# Patient Record
Sex: Male | Born: 1935 | Race: White | Hispanic: No | Marital: Married | Smoking: Former smoker
Health system: Southern US, Community
[De-identification: ages and names within clinical notes are randomized; demographics above are authoritative.]

## PROBLEM LIST (undated history)

## (undated) DIAGNOSIS — E119 Type 2 diabetes mellitus without complications: Secondary | ICD-10-CM

## (undated) DIAGNOSIS — Z77090 Contact with and (suspected) exposure to asbestos: Secondary | ICD-10-CM

## (undated) DIAGNOSIS — N183 Chronic kidney disease, stage 3 unspecified: Secondary | ICD-10-CM

## (undated) DIAGNOSIS — J449 Chronic obstructive pulmonary disease, unspecified: Secondary | ICD-10-CM

## (undated) DIAGNOSIS — I10 Essential (primary) hypertension: Secondary | ICD-10-CM

## (undated) DIAGNOSIS — K219 Gastro-esophageal reflux disease without esophagitis: Secondary | ICD-10-CM

## (undated) DIAGNOSIS — I2510 Atherosclerotic heart disease of native coronary artery without angina pectoris: Secondary | ICD-10-CM

## (undated) DIAGNOSIS — E785 Hyperlipidemia, unspecified: Secondary | ICD-10-CM

## (undated) DIAGNOSIS — R9389 Abnormal findings on diagnostic imaging of other specified body structures: Secondary | ICD-10-CM

## (undated) HISTORY — PX: OTHER SURGICAL HISTORY: SHX169

## (undated) HISTORY — DX: Chronic kidney disease, stage 3 unspecified: N18.30

## (undated) HISTORY — PX: CARDIAC CATHETERIZATION: SHX172

## (undated) HISTORY — DX: Type 2 diabetes mellitus without complications: E11.9

## (undated) HISTORY — DX: Hyperlipidemia, unspecified: E78.5

## (undated) HISTORY — DX: Essential (primary) hypertension: I10

## (undated) HISTORY — DX: Gastro-esophageal reflux disease without esophagitis: K21.9

## (undated) HISTORY — DX: Chronic kidney disease, stage 3 (moderate): N18.3

## (undated) HISTORY — DX: Chronic obstructive pulmonary disease, unspecified: J44.9

## (undated) HISTORY — DX: Atherosclerotic heart disease of native coronary artery without angina pectoris: I25.10

## (undated) HISTORY — DX: Contact with and (suspected) exposure to asbestos: Z77.090

## (undated) HISTORY — DX: Abnormal findings on diagnostic imaging of other specified body structures: R93.89

## (undated) HISTORY — PX: CHOLECYSTECTOMY: SHX55

## (undated) HISTORY — PX: INGUINAL HERNIA REPAIR: SUR1180

---

## 2001-01-13 ENCOUNTER — Encounter: Payer: Self-pay | Admitting: Family Medicine

## 2001-01-13 ENCOUNTER — Encounter: Admission: RE | Admit: 2001-01-13 | Discharge: 2001-01-13 | Payer: Self-pay | Admitting: Family Medicine

## 2001-08-27 ENCOUNTER — Encounter: Payer: Self-pay | Admitting: Preventative Medicine

## 2001-08-27 ENCOUNTER — Ambulatory Visit (HOSPITAL_COMMUNITY): Admission: RE | Admit: 2001-08-27 | Discharge: 2001-08-27 | Payer: Self-pay | Admitting: Preventative Medicine

## 2001-12-03 ENCOUNTER — Encounter: Admission: RE | Admit: 2001-12-03 | Discharge: 2001-12-03 | Payer: Self-pay | Admitting: Family Medicine

## 2001-12-03 ENCOUNTER — Encounter: Payer: Self-pay | Admitting: Family Medicine

## 2002-01-05 ENCOUNTER — Ambulatory Visit (HOSPITAL_BASED_OUTPATIENT_CLINIC_OR_DEPARTMENT_OTHER): Admission: RE | Admit: 2002-01-05 | Discharge: 2002-01-05 | Payer: Self-pay | Admitting: Urology

## 2002-01-08 ENCOUNTER — Encounter: Payer: Self-pay | Admitting: Emergency Medicine

## 2002-01-08 ENCOUNTER — Emergency Department (HOSPITAL_COMMUNITY): Admission: EM | Admit: 2002-01-08 | Discharge: 2002-01-08 | Payer: Self-pay | Admitting: Emergency Medicine

## 2002-04-17 ENCOUNTER — Encounter: Payer: Self-pay | Admitting: Urology

## 2002-04-17 ENCOUNTER — Encounter: Admission: RE | Admit: 2002-04-17 | Discharge: 2002-04-17 | Payer: Self-pay | Admitting: Urology

## 2002-06-01 ENCOUNTER — Ambulatory Visit (HOSPITAL_COMMUNITY): Admission: RE | Admit: 2002-06-01 | Discharge: 2002-06-01 | Payer: Self-pay | Admitting: Preventative Medicine

## 2002-06-01 ENCOUNTER — Encounter: Payer: Self-pay | Admitting: Preventative Medicine

## 2002-07-13 ENCOUNTER — Emergency Department (HOSPITAL_COMMUNITY): Admission: EM | Admit: 2002-07-13 | Discharge: 2002-07-13 | Payer: Self-pay | Admitting: Emergency Medicine

## 2002-07-24 ENCOUNTER — Encounter: Payer: Self-pay | Admitting: Urology

## 2002-07-24 ENCOUNTER — Encounter: Admission: RE | Admit: 2002-07-24 | Discharge: 2002-07-24 | Payer: Self-pay | Admitting: Urology

## 2002-07-27 ENCOUNTER — Ambulatory Visit (HOSPITAL_BASED_OUTPATIENT_CLINIC_OR_DEPARTMENT_OTHER): Admission: RE | Admit: 2002-07-27 | Discharge: 2002-07-27 | Payer: Self-pay | Admitting: Urology

## 2002-07-27 ENCOUNTER — Encounter: Payer: Self-pay | Admitting: Urology

## 2003-07-22 ENCOUNTER — Ambulatory Visit (HOSPITAL_COMMUNITY): Admission: RE | Admit: 2003-07-22 | Discharge: 2003-07-22 | Payer: Self-pay | Admitting: Urology

## 2004-01-04 ENCOUNTER — Encounter: Admission: RE | Admit: 2004-01-04 | Discharge: 2004-01-04 | Payer: Self-pay | Admitting: Family Medicine

## 2005-02-01 ENCOUNTER — Ambulatory Visit: Payer: Self-pay | Admitting: Internal Medicine

## 2005-02-09 ENCOUNTER — Ambulatory Visit: Payer: Self-pay | Admitting: Cardiology

## 2005-02-16 ENCOUNTER — Ambulatory Visit (HOSPITAL_COMMUNITY): Admission: RE | Admit: 2005-02-16 | Discharge: 2005-02-16 | Payer: Self-pay | Admitting: Internal Medicine

## 2005-03-27 ENCOUNTER — Encounter: Admission: RE | Admit: 2005-03-27 | Discharge: 2005-03-27 | Payer: Self-pay | Admitting: Family Medicine

## 2006-01-31 ENCOUNTER — Ambulatory Visit: Payer: Self-pay | Admitting: Internal Medicine

## 2006-05-27 ENCOUNTER — Encounter: Admission: RE | Admit: 2006-05-27 | Discharge: 2006-05-27 | Payer: Self-pay | Admitting: Interventional Cardiology

## 2006-05-30 ENCOUNTER — Inpatient Hospital Stay (HOSPITAL_BASED_OUTPATIENT_CLINIC_OR_DEPARTMENT_OTHER): Admission: RE | Admit: 2006-05-30 | Discharge: 2006-05-30 | Payer: Self-pay | Admitting: Interventional Cardiology

## 2007-01-15 DIAGNOSIS — K219 Gastro-esophageal reflux disease without esophagitis: Secondary | ICD-10-CM

## 2007-01-15 DIAGNOSIS — Z77090 Contact with and (suspected) exposure to asbestos: Secondary | ICD-10-CM | POA: Insufficient documentation

## 2007-01-15 DIAGNOSIS — E119 Type 2 diabetes mellitus without complications: Secondary | ICD-10-CM | POA: Insufficient documentation

## 2007-01-16 ENCOUNTER — Ambulatory Visit: Payer: Self-pay | Admitting: Internal Medicine

## 2008-01-16 ENCOUNTER — Ambulatory Visit: Payer: Self-pay | Admitting: Internal Medicine

## 2008-01-25 DIAGNOSIS — I2510 Atherosclerotic heart disease of native coronary artery without angina pectoris: Secondary | ICD-10-CM

## 2008-01-25 DIAGNOSIS — J449 Chronic obstructive pulmonary disease, unspecified: Secondary | ICD-10-CM

## 2008-02-02 ENCOUNTER — Ambulatory Visit: Payer: Self-pay | Admitting: Internal Medicine

## 2008-02-02 DIAGNOSIS — R0602 Shortness of breath: Secondary | ICD-10-CM

## 2009-02-08 ENCOUNTER — Ambulatory Visit: Payer: Self-pay | Admitting: Internal Medicine

## 2009-08-16 ENCOUNTER — Inpatient Hospital Stay (HOSPITAL_COMMUNITY): Admission: EM | Admit: 2009-08-16 | Discharge: 2009-08-18 | Payer: Self-pay | Admitting: Cardiology

## 2009-08-16 ENCOUNTER — Encounter: Payer: Self-pay | Admitting: Emergency Medicine

## 2009-08-16 ENCOUNTER — Ambulatory Visit: Payer: Self-pay | Admitting: Cardiovascular Disease

## 2009-08-16 ENCOUNTER — Other Ambulatory Visit: Payer: Self-pay | Admitting: Cardiology

## 2009-08-16 ENCOUNTER — Other Ambulatory Visit: Payer: Self-pay | Admitting: Emergency Medicine

## 2009-10-20 ENCOUNTER — Ambulatory Visit (HOSPITAL_COMMUNITY): Admission: RE | Admit: 2009-10-20 | Discharge: 2009-10-20 | Payer: Self-pay | Admitting: Ophthalmology

## 2010-02-09 ENCOUNTER — Ambulatory Visit: Payer: Self-pay | Admitting: Internal Medicine

## 2010-04-23 ENCOUNTER — Encounter: Payer: Self-pay | Admitting: Orthopedic Surgery

## 2010-05-02 NOTE — Assessment & Plan Note (Signed)
Summary: 12 months/apc   Primary Provider/Referring Provider:  Dr. Manus Gunning  CC:  Yearly follow up visit-COPD.Marland Kitchen  History of Present Illness:  HISTORY:  One year follow up. He says that his sense of smell is not as good, but he is not aware of significant nasal congestion or post-nasal drainage. Coughs up some white mucus occasionally. Nothing bloody or purulent. No chest pain. He had a heart cath for Dr. Garnette Scheuermann demonstrating a non-critical coronary disease and he carries nitroglycerin, but he has not needed it. He had a second pneumococcal vaccine in 2005. He requests flu shot.  01/16/08- Feeling well exceept for back pain.No sputm. Last month 5 days sinus cong. Likes Advair.Discussed Flu vax. DOE hills or hurry. No waso from breathing. Some angina, stable- Dr. Katrinka Blazing.  01/15/08- Comes with wife. Denies acute change, cough, phlegm, blood, nodes, palpitation. Occ stable angina-Dr. Katrinka Blazing. Bach ache- Dr Manus Gunning. No change exercise/ dyspnea level.Discussed flu season.  February 08, 2009- COPD, CAD, hx asbestos exposure..............................Marland Kitchenwife here Got flu vax. Says he is doing well. Heat and humidity are hard on him. Says cardiac and blood sugar status is good. Denies any respiratory infection, dyspnea, cough or wheeze. CXR- stable pleural plaques PFT- mild obstructive disease with little dilator response - 97%, 97%, 98% 316m  February 09, 2010- COPD, CAD, hx asbestos exposure..............................Marland Kitchenwife here Nurse-CC: Yearly follow up visit-COPD. Got flu shot. Says breathing has been stable. Had cataract surgery. Hosp by Dr Katrinka Blazing R/O MI, heart cath showed no MI, improved CAD. Uses Mucinex to clear mucus. Able to [employer], walk dog. DOE with brisk walk and stairs. Uses nebulizer two times a day and extra if needed. Uses Advair 1x daily. Dealing w/ donut hole.   Preventive Screening-Counseling & Management  Alcohol-Tobacco     Smoking Status: quit  Packs/Day: 2.0     Year Started: 1951     Year Quit: 2001  Current Medications (verified): 1)  Advair Diskus 250-50 Mcg/dose Misc (Fluticasone-Salmeterol) .Marland Kitchen.. 1 Puff Two Times A Day 2)  Glipizide 5 Mg  Tabs (Glipizide) .Marland Kitchen.. 1 Two Times A Day 3)  Glucophage 1000 Mg  Tabs (Metformin Hcl) .... Bid 4)  Lisinopril 5 Mg  Tabs (Lisinopril) .Marland Kitchen.. 1 Once Daily 5)  Hydrochlorothiazide 25 Mg  Tabs (Hydrochlorothiazide) .Marland Kitchen.. 1 Once Daily 6)  Crestor 5 Mg Tabs (Rosuvastatin Calcium) .... Take 1 By Mouth Once Daily 7)  Januvia 100 Mg  Tabs (Sitagliptin Phosphate) 8)  Proair Hfa 108 (90 Base) Mcg/act Aers (Albuterol Sulfate) .... 2 Puffs Every 4 Hours As Needed 9)  Aspirin Adult Low Strength 81 Mg Tbec (Aspirin) .Marland Kitchen.. 1 Once Daily 10)  Isosorbide Dinitrate 30 Mg Tabs (Isosorbide Dinitrate) .Marland Kitchen.. 1  Every Morning 11)  Nitro-Dur 0.4 Mg/hr Pt24 (Nitroglycerin) .... As Needed 12)  Albuterol Sulfate (2.5 Mg/48ml) 0.083% Nebu (Albuterol Sulfate) .Marland Kitchen.. 1 Neb Four Times A Day As Needed 13)  Nebulizer Machine For [employer], [employer] .... Four Times A Day As Needed 14)  Travatan Z 0.004 % Soln (Travoprost) .Marland Kitchen.. 1 Drop  in Both Eyes Every Evening  Allergies (verified): 1)  ! Morphine 2)  Codeine  Past History:  Past Medical History: Last updated: 01/16/2008 COPD (ICD-496) Hx of HISTORY OF ASBESTOS EXPOSURE (ICD-V15.84) * ABNORMAL CHEST X-RAY CAD (ICD-414.00) DM (ICD-250.00) GERD (ICD-530.81)  Family History: Last updated: 2008/02/27 mother died of natural causes father died of heart disease, lung cancer 2 siblings both healthy  Social History: Last updated: 2008/02/27 Former smoker.  Quit in 2003.  Smoked 2 ppd x 50 years Married- wife has lung ca 2  Children Quit ETOH  Risk Factors: Smoking Status: quit (02/09/2010) Packs/Day: 2.0 (02/09/2010)  Past Surgical History: Inguinal hernia Cardiac cath- 2011 Dr Katrinka Blazing Cholecystectomy Cataracts -2011  Social History: Packs/Day:  2.0  Review  of Systems      See HPI       The patient complains of shortness of breath with activity.  The patient denies shortness of breath at rest, productive cough, non-productive cough, coughing up blood, chest pain, irregular heartbeats, acid heartburn, indigestion, loss of appetite, weight change, abdominal pain, difficulty swallowing, sore throat, tooth/dental problems, headaches, nasal congestion/difficulty breathing through nose, and sneezing.    Vital Signs:  Patient profile:   77 year old male Height:      68 inches Weight:      174.50 pounds BMI:     26.63 O2 Sat:      96 % on Room air Pulse rate:   77 / minute BP sitting:   130 / 72  (left arm) Cuff size:   regular  Vitals Entered By: Reynaldo Minium CMA (February 09, 2010 9:10 AM)  O2 Flow:  Room air CC: Yearly follow up visit-COPD.   Physical Exam  Additional Exam:  General: A/Ox3; pleasant and cooperative, NAD, SKIN: no rash, lesions NODES: no lymphadenopathy HEENT: [place]/AT, EOM- WNL, Conjuctivae- clear, PERRLA, TM-WNL, Nose- clear, Throat- clear and wnl, Mallampati  III NECK: Supple w/ fair ROM, JVD- none, normal carotid impulses w/o bruits Thyroid- normal to palpation CHEST: Clear to P&A HEART: RRR, no m/g/r heard ABDOMEN: overweight ZOX:WRUE, nl pulses, no edema  NEURO: Grossly intact to observation      Impression & Recommendations:  Problem # 1:  COPD (ICD-496) Clinically controlled. Finances may be a problem later. No changes to offer for now. We discussed sme medication options.  Problem # 2:  Hx of HISTORY OF ASBESTOS EXPOSURE (ICD-V15.84) Pleural plaques. No clinical activity. Will update CXR   Medications Added to Medication List This Visit: 1)  Crestor 5 Mg Tabs (Rosuvastatin calcium) .... Take 1 by mouth once daily  Other Orders: Est. Patient Level III (45409) T-2 View CXR (71020TC)  Patient Instructions: 1)  Please schedule a follow-up appointment in 1 year. 2)  A chest x-ray has been  recommended.  Your imaging study may require preauthorization.  3)  Sample Advair 250

## 2010-06-17 LAB — GLUCOSE, CAPILLARY: Glucose-Capillary: 180 mg/dL — ABNORMAL HIGH (ref 70–99)

## 2010-06-18 LAB — BASIC METABOLIC PANEL
BUN: 19 mg/dL (ref 6–23)
Chloride: 101 mEq/L (ref 96–112)
Creatinine, Ser: 1.16 mg/dL (ref 0.4–1.5)
Glucose, Bld: 239 mg/dL — ABNORMAL HIGH (ref 70–99)

## 2010-06-18 LAB — HEMOGLOBIN AND HEMATOCRIT, BLOOD: Hemoglobin: 11.7 g/dL — ABNORMAL LOW (ref 13.0–17.0)

## 2010-06-19 LAB — LIPID PANEL
Cholesterol: 108 mg/dL (ref 0–200)
LDL Cholesterol: 55 mg/dL (ref 0–99)
Total CHOL/HDL Ratio: 3.4 RATIO
Triglycerides: 104 mg/dL (ref <150)
VLDL: 21 mg/dL (ref 0–40)

## 2010-06-19 LAB — BASIC METABOLIC PANEL
BUN: 18 mg/dL (ref 6–23)
CO2: 26 mEq/L (ref 19–32)
CO2: 27 mEq/L (ref 19–32)
Calcium: 8.9 mg/dL (ref 8.4–10.5)
Chloride: 102 mEq/L (ref 96–112)
Creatinine, Ser: 1.23 mg/dL (ref 0.4–1.5)
GFR calc Af Amer: >60 mL/min (ref >60)
GFR calc non Af Amer: 60 mL/min — ABNORMAL LOW (ref >60)
Glucose, Bld: 155 mg/dL — ABNORMAL HIGH (ref 70–99)
Glucose, Bld: 192 mg/dL — ABNORMAL HIGH (ref 70–99)
Potassium: 4.2 mEq/L (ref 3.5–5.1)
Sodium: 137 mEq/L (ref 135–145)

## 2010-06-19 LAB — CARDIAC PANEL(CRET KIN+CKTOT+MB+TROPI)
CK, MB: 7.2 ng/mL (ref 0.3–4.0)
CK, MB: 7.2 ng/mL (ref 0.3–4.0)
CK, MB: 8.9 ng/mL (ref 0.3–4.0)
Relative Index: 1.4 (ref 0.0–2.5)
Total CK: 625 U/L — ABNORMAL HIGH (ref 7–232)
Total CK: 696 U/L — ABNORMAL HIGH (ref 7–232)
Troponin I: <0.01 ng/mL (ref 0.00–0.06)

## 2010-06-19 LAB — APTT: aPTT: 158 seconds — ABNORMAL HIGH (ref 24–37)

## 2010-06-19 LAB — CBC
HCT: 37.2 % — ABNORMAL LOW (ref 39.0–52.0)
HCT: 34.9 % — ABNORMAL LOW (ref 39.0–52.0)
HCT: 36 % — ABNORMAL LOW (ref 39.0–52.0)
Hemoglobin: 12.9 g/dL — ABNORMAL LOW (ref 13.0–17.0)
Hemoglobin: 11.9 g/dL — ABNORMAL LOW (ref 13.0–17.0)
Hemoglobin: 12.3 g/dL — ABNORMAL LOW (ref 13.0–17.0)
MCHC: 34.5 g/dL (ref 30.0–36.0)
MCHC: 34 g/dL (ref 30.0–36.0)
MCHC: 34.3 g/dL (ref 30.0–36.0)
MCV: 79.2 fL (ref 78.0–100.0)
MCV: 80.6 fL (ref 78.0–100.0)
Platelets: 185 10*3/uL (ref 150–400)
Platelets: 148 10*3/uL — ABNORMAL LOW (ref 150–400)
RBC: 4.47 MIL/uL (ref 4.22–5.81)
RDW: 17.1 % — ABNORMAL HIGH (ref 11.5–15.5)
RDW: 17 % — ABNORMAL HIGH (ref 11.5–15.5)

## 2010-06-19 LAB — BRAIN NATRIURETIC PEPTIDE: Pro B Natriuretic peptide (BNP): <30 pg/mL (ref 0.0–100.0)

## 2010-06-19 LAB — POCT CARDIAC MARKERS: Troponin i, poc: <0.05 ng/mL (ref 0.00–0.09)

## 2010-06-19 LAB — DIFFERENTIAL
Basophils Absolute: 0 10*3/uL (ref 0.0–0.1)
Basophils Relative: 0 % (ref 0–1)
Eosinophils Absolute: 0.2 10*3/uL (ref 0.0–0.7)
Eosinophils Relative: 4 % (ref 0–5)
Lymphocytes Relative: 17 % (ref 12–46)
Monocytes Absolute: 0.5 10*3/uL (ref 0.1–1.0)

## 2010-06-19 LAB — GLUCOSE, CAPILLARY
Glucose-Capillary: 185 mg/dL — ABNORMAL HIGH (ref 70–99)
Glucose-Capillary: 189 mg/dL — ABNORMAL HIGH (ref 70–99)
Glucose-Capillary: 235 mg/dL — ABNORMAL HIGH (ref 70–99)

## 2010-06-19 LAB — HEPARIN LEVEL (UNFRACTIONATED): Heparin Unfractionated: 0.35 IU/mL (ref 0.30–0.70)

## 2010-08-15 NOTE — Assessment & Plan Note (Signed)
Stephen [name]                             PULMONARY OFFICE NOTE   Stephen [name], Stephen [name]                     MRN:          [id number]  DATE:01/16/2007                            DOB:          1932-05-12    PROBLEMS:  1. Chronic obstructive pulmonary disease.  2. History of asbestos exposure.  3. Abnormal chest x-ray.  4. Esophageal reflux.  5. Diabetes.  6. Coronary disease.   HISTORY:  One year follow up. He says that his sense of smell is not as  good, but he is not aware of significant nasal congestion or post-nasal  drainage. Coughs up some white mucus occasionally. Nothing bloody or  purulent. No chest pain. He had a heart cath for Dr. Garnette Scheuermann  demonstrating a non-critical coronary disease and he carries  nitroglycerin, but he has not needed it. He had a second pneumococcal  vaccine in 2005. He requests flu shot.   MEDICATIONS:  1. Spiriva.  2. Asmanex 2 puffs daily.  3. Albuterol 2 puffs q.i.d. p.r.n.  4. Glipizide 5 mg.  5. Glucophage 1000 mg b.i.d.  6. Lisinopril 5 mg.  7. Hydrochlorothiazide 25 mg.  8. Crestor 5 mg.  9. Trusopt 2%.  10.Xalatan.  11.Januvia 100 mg.  12.Albuterol rescue inhaler.  13.Nitroglycerin p.r.n.   Drug intolerant nausea from CODEINE.   Chest x-ray in 2005 had shown stable bilateral calcified plural plaques  that are unchanged from earlier that year.   OBJECTIVE:  Weight 185 pounds, blood pressure 114/60, pulse 81, room air  saturation 98%. He is alert. Chest sounds clear. I do not hear crackles  or rubs. He does not cough. I find no adenopathy. Heart sounds are  regular with no murmur or gallop. There is no edema. No significant  clubbing.   IMPRESSION:  1. Stable chronic obstructive pulmonary disease.  2. Asbestos plaques.   PLAN:  Chest x-ray, flu vaccine discussed and given. Schedule return 1  year, earlier p.r.n.     Stephen D. Maple Hudson, MD, Stephen [name], FACP  Electronically Signed    CDY/MedQ   DD: 01/17/2007  DT: 01/18/2007  Job #: [id number]   cc:   Stephen [name]. Stephen [name], M.D.

## 2010-08-18 NOTE — Cardiovascular Report (Signed)
NAMECYAN, Stephen [name] NO.:  [id number]   MEDICAL RECORD NO.:  [id number]          PATIENT TYPE:  OIB   LOCATION:  1966                         FACILITY:  [facility]   PHYSICIAN:  Lyn Records, M.D.   DATE OF BIRTH:  Aug 28, 1932   DATE OF PROCEDURE:  05/30/2006  DATE OF DISCHARGE:                            CARDIAC CATHETERIZATION   INDICATIONS:  The patient had been having exertional chest tightness and  has multiple risk factors for coronary artery disease including his age,  prior cigarette smoking history, hyperlipidemia and diabetes.  He had a  Cardiolite study performed on May 22, 2006, that demonstrated  inferobasal ischemia.  The study is being done to define coronary  anatomy.   PROCEDURES PERFORMED:  1. Left heart catheterization.  2. Selective coronary angiography.  3. Left ventriculography.   DESCRIPTION:  A 4-French sheath was placed in the right femoral artery  using the modified Seldinger technique.  A 4-French A2 multipurpose  catheter was used for hemodynamic recordings, left ventriculography by  hand injection, and selective right coronary angiography.  A 4-French #4  left Judkins catheter was used for left coronary angiography.  The  patient tolerated the procedure without complications.   RESULTS:   HEMODYNAMIC DATA:  1. Aortic pressure 134/70.      a.     Left ventricular pressure 141/10.   LEFT VENTRICULOGRAPHY:  The left ventriculogram demonstrates low normal  LV function with EF of 50%.  No regional wall motion abnormality is  noted.  No mitral regurgitation is noted.   CORONARY ANGIOGRAPHY:  1. Left main coronary:  Widely patent.  2. Left anterior descending coronary:  This is a large vessel that      wraps around the left ventricular apex.  On the first several      images the mid-LAD was narrowed diffusely.  After intracoronary      nitroglycerin the vessel was widely patent.  One dominant diagonal      branch arises from  the proximal vessel.  Proximal to this there is      eccentric 30-40% narrowing in the LAD.  No significant obstruction      in the LAD is noted.  3. Circumflex artery:  The circumflex coronary artery is a large      vessel that trifurcates on the left lateral wall and contains no      obstruction.  4. Right coronary:  The right coronary is a dominant vessel giving to      left ventricular branches and a large posterior descending branch.      The vessel is normal without any significant obstruction.   CONCLUSION:  1. Normal left ventricular function.  2. Less than 50% obstruction in the proximal left anterior descending      artery, possibly causing endothelial dysfunction that could account      for the patient's discomfort.  No significant obstructive lesions      are noted.   PLAN:  Aggressive risk factor modification including increasing the dose  of the patient's Crestor to 4-10 mg per day, one aspirin per  day, p.r.n.  sublingual nitroglycerin if chest discomfort.  No further cardiac  evaluation is necessary.  Aggressive blood sugar control will also be  helpful.      Lyn Records, M.D.  Electronically Signed     HWS/MEDQ  D:  05/30/2006  T:  05/30/2006  Job:  [id number]   cc:   Bryan Lemma. Manus Gunning, M.D.

## 2010-08-18 NOTE — H&P (Signed)
[name], [name] NO.:  [id number]   MEDICAL RECORD NO.:  [id number]          PATIENT TYPE:  OIB   LOCATION:  1966                         FACILITY:  [facility]   PHYSICIAN:  Lyn Records, M.D.   DATE OF BIRTH:  [birth year]   DATE OF ADMISSION:  05/30/2006  DATE OF DISCHARGE:                              HISTORY & PHYSICAL   CHIEF COMPLAINT:  Chest pain.   [name] is a 77 year old male with no known history of coronary  artery disease who has recently experienced substernal chest pain, both  with and without exertion.  When he does have chest pain with exertion,  his pain is better with rest.  He denies any other constitutional  symptoms, including dyspnea or palpitations.  The patient was sent for a  stress Cardiolite on May 22, 2006 by his primary care physician and  this showed inferior basal ischemia.  He is now here for cardiac  catheterization.   ALLERGIES:  CODEINE.   MEDICATIONS:  1. Spiriva daily.  2. Asmanex 2 puffs q.p.m.  3. Albuterol p.r.n.  4. Glipizide 5 mg b.i.d.  5. Glucophage 1000 mg b.i.d.  6. Lisinopril 5 mg a day.  7. Hydrochlorothiazide 25 mg a day.  8. Crestor 5 mg a day.  9. Trusopt 2% one drop both eyes b.i.d.  10.Xalatan 0.05% one drop both eyes q.h.s.  11.Januvia 100 mg daily.  12.The patient all meds today and he knows to hold the Glucophage and      Januvia for 48 hours post-procedure.   SOCIAL HISTORY:  He quit smoking 3 years ago.  No alcohol or illicit  drug use.   FAMILY HISTORY:  Dad died with fluid around his heart.  Mom died of  natural causes.   PAST MEDICAL HISTORY:  1. Diabetes, oral agents.  2. Hyperlipidemia, treated.  3. Chronic bronchitis, multiple medications.  4. Nephrolithiasis.  5. Glaucoma, treated.  6. Status post cholecystectomy.  7. Status post abdominal hernia repair.   VITAL SIGNS:  Blood pressure 120/60.  Pulse 70.  Respirations 20.  HEENT:  Grossly normal.  No carotid or  subclavian bruits.  No JVD or  thyromegaly.  Sclerae are clear.  Conjunctivae normal.  Nares without  drainage.  CHEST:  Clear to auscultation bilaterally.  No wheezing or rhonchi.  HEART:  Regular rate and rhythm.  No gross murmur.  ABDOMEN:  Good bowel sounds.  Nontender, nondistended.  No masses.  No  bruits.  EXTREMITIES:  No peripheral edema.  No femoral bruits.  NEURO:  Normal mood and affect.  SKIN:  Warm and dry.   LAB STUDIES:  Show a hemoglobin of 11.4, hematocrit of 33.6, white count  4.5, platelets 189.  PT 13.1, INR 1.15.  Sodium 138, potassium 4.6, BUN  16, creatinine 1.3, glucose 224.  LFTs are normal.  Chest x-ray shows no  active disease.   ASSESSMENT/PLAN:  1. Chest pain.  2. Diabetes mellitus, treated.  3. Hyperlipidemia, treated.  4. Glaucoma, treated.  5. Chronic bronchitis, on multiple inhalers.  6. Nephrolithiasis.  7. Status post cholecystectomy.  8.  Status post abdominal hernia repair.   The patient is here for cardiac catheterization.  He understands the  risks and benefits of the procedure, including the risk of heart attack,  stroke, death or groin complications including, but not limited to,  ecchymosis.  He agrees to proceed with the cardiac catheterization,  which will be performed by Dr. Verdis Prime.      Guy Franco, P.A.      Lyn Records, M.D.  Electronically Signed    LB/MEDQ  D:  05/30/2006  T:  05/30/2006  Job:  [id number]   cc:   Gaylan Gerold, M.D.

## 2010-08-18 NOTE — Op Note (Signed)
   NAME:  Stephen [name], Stephen [name] NO.:  [id number]   MEDICAL RECORD NO.:  [id number]                   PATIENT TYPE:  AMB   LOCATION:  [facility]                                 FACILITY:  [facility]   PHYSICIAN:  Valetta Fuller, M.D.               DATE OF BIRTH:  01-11-1933   DATE OF PROCEDURE:  DATE OF DISCHARGE:                                 OPERATIVE REPORT   PREOPERATIVE DIAGNOSES:  Gross hematuria with intermittent voiding symptoms  and nonspecific right back discomfort.   POSTOPERATIVE DIAGNOSES:  Gross hematuria with intermittent voiding symptoms  and nonspecific right back discomfort.   PROCEDURE:  Cystoscopy, bilateral retrograde pyelography.   SURGEON:  Valetta Fuller, M.D.   ANESTHESIA:  General.   INDICATIONS FOR PROCEDURE:  Stephen [name] is a 78 year old male. He for  several weeks now has had some nonspecific low back discomfort. The pain was  initially in the lumbar region and also on the right side of his lower back  with some radiation across his back. It was not classic for renal colic but  certainly could be considered consistent with that as a possible diagnosis.  He also had some increased irritative voiding symptoms and certainly a  distal ureteral stone was consideration for diagnosis. Dr. Manus Gunning obtained  a CT scan which showed no abnormalities. We noted that he had continued  microscopic hematuria as well as some blood tinged urine grossly. We  recommended cystoscopy. The patient was hesitant to have that done in our  office. We felt it reasonable to go ahead with an anesthetic so we could  also do retrograde pyelograms to rule out a small filling defect that may  have been missed on the CT scan.   TECHNIQUE AND FINDINGS:  The patient was brought to the operating room where  he had successful induction of general anesthesia. Cystoscopy revealed some  trilobar hyperplasia with a medium size middle lobe. The bladder was  endoscopically  normal and saw no evidence of any abnormalities. Both  ureteral orifices were normal and there was no evidence of edema on the  __________  trigone. Retrograde pyelogram showed no evidence of obstruction  or filling defects bilaterally. Overall, we felt there was no obvious  urologic pathology. He was brought to the recovery room in stable condition.                                               Valetta Fuller, M.D.   DSG/MEDQ  D:  01/05/2002  T:  01/05/2002  Job:  [id number]   cc:   Bryan Lemma. Manus Gunning, M.D.  [address]  [place] [address]  Fax: [phone]

## 2010-08-18 NOTE — Assessment & Plan Note (Signed)
[facility]                               PULMONARY OFFICE NOTE   DUVALL, COMES                     MRN:          [id number]  DATE:01/31/2006                            DOB:          1932-06-06    PROBLEMS:  1. Chronic obstructive pulmonary disease.  2. History of asbestos exposure.  3. Abnormal chest x-ray.  4. Esophageal reflux.  5. Diabetes.  6. History kidney stones.   HISTORY:  He comes for followup today feeling stable through the year, with  no colds this year.  He did avoid the heat.  Occasionally sinuses kick up  causing postnasal drainage and some throat-clearing cough.  There has been  no blood, nothing purulent.  Exertional dyspnea if he hurries, but he is  okay mowing the grass as long as he paces himself.  Dr. Manus Gunning gets a chest  x-ray about once a year.  He has not had chest pain, bleeding or weight  loss.   MEDICATIONS:  1. Spiriva.  2. Asmanex 2 puffs daily.  3. Albuterol 2 puffs q.i.d. p.r.n.  4. Glipizide 5 mg.  5. Glucophage 1000 mg b.i.d.  6. Lisinopril 5 mg.  7. Hydrochlorothiazide 25 mg.  8. Crestor 5 mg.  9. Trusopt 2% eye drops.  10.Xalatan eye drops.  11.Januvia 100 mg.  12.Albuterol rescue inhaler.   ALLERGIES:  Drug intolerance to CODEINE with nausea.   OBJECTIVE:  Weight 183 pounds, BP 116/62, pulse regular 80, room air  saturation 98%.  He is a stocky-built man with no adenopathy.  Heart sounds are regular.  There is no neck vein distention, clubbing or edema.  Lungs sound clear.  Specifically I do not hear fine rales or crackles, or  any pleural rub.   PFT:  There is mild to moderate obstructive disease, mainly seen in small  airway flows which are at 32% of predicted, with slight response to  bronchodilator.  Measured total lung capacity is normal.  Diffusion is  mildly reduced at 74%.  Chest CT from February 09, 2005, had shown bilateral  benign-appearing pleural plaques consistent with  known asbestos exposure,  post cholecystectomy changes with fatty liver.   IMPRESSION:  1. Mild chronic obstructive pulmonary disease.  2. Asbestos exposure with pleural plaques, but without parenchymal      fibrosis and with no suspicion at this point of active process.   PLAN:  1. We will get his chest x-ray report for our file, noting that he has had      flu vaccine and had his second pneumococcal vaccine in 2005.  2. Schedule return in 1 year, anticipating a followup chest CT at that      point because of his asbestos exposure.     Clinton D. Maple Hudson, MD, Tonny Bollman, FACP  Electronically Signed    CDY/MedQ  DD: 02/02/2006  DT: 02/03/2006  Job #: [id number]   cc:   Bryan Lemma. Manus Gunning, M.D.  Valetta Fuller, M.D.

## 2010-11-29 ENCOUNTER — Other Ambulatory Visit: Payer: Self-pay | Admitting: Internal Medicine

## 2011-02-06 ENCOUNTER — Encounter: Payer: Self-pay | Admitting: Internal Medicine

## 2011-02-07 ENCOUNTER — Ambulatory Visit (INDEPENDENT_AMBULATORY_CARE_PROVIDER_SITE_OTHER)
Admission: RE | Admit: 2011-02-07 | Discharge: 2011-02-07 | Disposition: A | Discharge status: Home / Self Care | Payer: Medicare Other | Source: Ambulatory Visit | Attending: Internal Medicine | Admitting: Internal Medicine

## 2011-02-07 ENCOUNTER — Encounter: Payer: Self-pay | Admitting: Internal Medicine

## 2011-02-07 ENCOUNTER — Ambulatory Visit (INDEPENDENT_AMBULATORY_CARE_PROVIDER_SITE_OTHER): Payer: Medicare Other | Admitting: Internal Medicine

## 2011-02-07 VITALS — BP 130/60 | HR 83 | Ht 68.0 in | Wt 168.4 lb

## 2011-02-07 DIAGNOSIS — Z77090 Contact with and (suspected) exposure to asbestos: Secondary | ICD-10-CM

## 2011-02-07 DIAGNOSIS — J449 Chronic obstructive pulmonary disease, unspecified: Secondary | ICD-10-CM

## 2011-02-07 NOTE — Patient Instructions (Signed)
Order- CXR- COPD, hx asbestos exposure

## 2011-02-07 NOTE — Progress Notes (Signed)
02/07/11- 71 yoM former smoker followed for COPD with history of asbestos exposure, complicated by CAD, DM, GERD, wife here. LOV-02/09/2010 He says he is "fine". Uses Advair once daily and his nebulizer machine up to 3 times daily. Carries his rescue inhaler but rarely uses it. Cough is usually productive but only white. He denies chest pain. Uses a riding mower to [employer] his own lawn. Chest x-ray: 02/09/2010-COPD and old pleural plaques reflecting asbestos exposure. No new process. I discussed with him the difference between pleural plaques and mesothelioma.  ROS, see HPI Constitutional:   No-   weight loss, night sweats, fevers, chills, fatigue, lassitude. HEENT:   No-  headaches, difficulty swallowing, tooth/dental problems, sore throat,       No-  sneezing, itching, ear ache, nasal congestion, post nasal drip,  CV:  No-   chest pain, orthopnea, PND, swelling in lower extremities, anasarca, dizziness, palpitations Resp: No- acute  shortness of breath with exertion or at rest.              No-   productive cough,  No non-productive cough,  No- coughing up of blood.              No-   change in color of mucus.  No- wheezing.   Skin: No-   rash or lesions. GI:  No-   heartburn, indigestion, abdominal pain, nausea, vomiting, diarrhea,                 change in bowel habits, loss of appetite GU: No-   dysuria, change in color of urine, no urgency or frequency.  No- flank pain. MS:  No-   joint pain or swelling.  No- decreased range of motion.  No- back pain. Neuro-     nothing unusual Psych:  No- change in mood or affect. No depression or anxiety.  No memory loss.  OBJ General- Alert, Oriented, Affect-appropriate, Distress- none acute Skin- rash-none, lesions- none, excoriation- none Lymphadenopathy- none Head- atraumatic            Eyes- Gross vision intact, PERRLA, conjunctivae clear secretions            Ears- Hearing, canals-normal            Nose- Clear, no-Septal dev, mucus, polyps,  erosion, perforation             Throat- Mallampati II , mucosa clear , drainage- none, tonsils- atrophic Neck- flexible , trachea midline, no stridor , thyroid nl, carotid no bruit Chest - symmetrical excursion , unlabored           Heart/CV- RRR , no murmur , no gallop  , no rub, nl s1 s2                           - JVD- none , edema- none, stasis changes- none, varices- none           Lung- clear to P&A, wheeze- none, cough- none , dullness-none, rub- none           Chest wall-  Abd- tender-no, distended-no, bowel sounds-present, HSM- no Br/ Gen/ Rectal- Not done, not indicated Extrem- cyanosis- none, clubbing, none, atrophy- none, strength- nl Neuro- grossly intact to observation

## 2011-02-09 NOTE — Assessment & Plan Note (Signed)
Pleural plaques consistent with his history of asbestos exposure. I discussed with him and his wife again the identified risk of lung cancer. I do not see evidence of parenchymal asbestosis.

## 2011-02-09 NOTE — Assessment & Plan Note (Signed)
He is maintaining a quality of life able to do much of his own necessary chores with little subjective change. Plan: Continue present meds. Update chest x-ray.

## 2011-02-14 NOTE — Progress Notes (Signed)
Quick Note:  Pt aware of results. ______ 

## 2011-09-29 ENCOUNTER — Encounter (HOSPITAL_COMMUNITY): Payer: Self-pay | Admitting: Emergency Medicine

## 2011-09-29 ENCOUNTER — Emergency Department (HOSPITAL_COMMUNITY)
Admission: EM | Admit: 2011-09-29 | Discharge: 2011-09-29 | Disposition: A | Discharge status: Home / Self Care | Payer: Medicare Other | Attending: Emergency Medicine | Admitting: Emergency Medicine

## 2011-09-29 ENCOUNTER — Emergency Department (HOSPITAL_COMMUNITY): Payer: Medicare Other

## 2011-09-29 DIAGNOSIS — E119 Type 2 diabetes mellitus without complications: Secondary | ICD-10-CM | POA: Insufficient documentation

## 2011-09-29 DIAGNOSIS — J449 Chronic obstructive pulmonary disease, unspecified: Secondary | ICD-10-CM | POA: Insufficient documentation

## 2011-09-29 DIAGNOSIS — M79609 Pain in unspecified limb: Secondary | ICD-10-CM | POA: Insufficient documentation

## 2011-09-29 DIAGNOSIS — Z79899 Other long term (current) drug therapy: Secondary | ICD-10-CM | POA: Insufficient documentation

## 2011-09-29 DIAGNOSIS — M79622 Pain in left upper arm: Secondary | ICD-10-CM

## 2011-09-29 DIAGNOSIS — I2510 Atherosclerotic heart disease of native coronary artery without angina pectoris: Secondary | ICD-10-CM | POA: Insufficient documentation

## 2011-09-29 DIAGNOSIS — J4489 Other specified chronic obstructive pulmonary disease: Secondary | ICD-10-CM | POA: Insufficient documentation

## 2011-09-29 MED ORDER — HYDROCODONE-ACETAMINOPHEN 5-325 MG PO TABS: 1.0000 | ORAL_TABLET | Freq: Four times a day (QID) | ORAL | Status: AC | PRN

## 2011-09-29 MED ORDER — HYDROCODONE-ACETAMINOPHEN 5-325 MG PO TABS
2.0000 | ORAL_TABLET | Freq: Once | ORAL | Status: AC
  Administered 2011-09-29: 2 via ORAL
  Filled 2011-09-29: qty 2

## 2011-09-29 MED ORDER — METHOCARBAMOL 500 MG PO TABS: ORAL_TABLET | ORAL | Status: DC

## 2011-09-29 NOTE — ED Notes (Signed)
Patient does not need anything at this time. 

## 2011-09-29 NOTE — ED Notes (Signed)
Family at bedside. 

## 2011-09-29 NOTE — ED Notes (Addendum)
Patient c/o left upper arm pain that started yesterday after supper. Per patient mowing yard prior to pain starting-patient thinks he was bitten by "small black bugs". Per patient swelling in back of arm this morning that is no longer there.  Denies any SOB or chest pain. Patient unable to lift arm.

## 2011-09-29 NOTE — Discharge Instructions (Signed)
Ice packs over the pain in the muscles in your upper arm. Take the norco for pain. You can also take ibuprofen 400 mg 3 times a day. Take the robaxin for your muscle soreness.  Wear the sling for comfort, but not all the time. Call Dr Diamantina Providence office on Monday to have him recheck your arm this week. You seem to have pain in your triceps muscle without obvious rupture on your exam.

## 2011-09-29 NOTE — ED Provider Notes (Cosign Needed)
History  This chart was scribed for Ward Givens, MD by Bennett Scrape. This patient was seen in room APA12/APA12 and the patient's care was started at 10:08AM.  CSN: [id number]  Arrival date & time 09/29/11  0930   First MD Initiated Contact with Patient 09/29/11 1008      Chief Complaint  Patient presents with  . Arm Pain    Patient is a 78 y.o. male presenting with arm pain. The history is provided by the patient. No language interpreter was used.  Arm Pain This is a new problem. The current episode started 12 to 24 hours ago. The problem occurs constantly. The problem has not changed since onset.Nothing relieves the symptoms. He has tried a cold compress, ASA and a warm compress for the symptoms. The treatment provided no relief.    STYLIANOS STRADLING is a 78 y.o. male who presents to the Emergency Department complaining of approximately 18 hours of sudden onset, non-changing, constant upper left arm pain that started  after the he mowed the lawn with a riding lawn mower. He states that he was attacked by an unfamiliar fly-like insect while mowing the lawn with a riding lawn mower. He states that he slapped them off of his arm after a few seconds. He denies having insect bites or feeling a popping sensation during that time. He relates after he mowed the grass and ate dinner, he started to get up off the couch and had acute onset of pain in his left medial upper arm. He reports that movement of the shoulder and elbow worsen the pain in the same area, not the shoulder or elbow and that pressure improves the pain. He reports that he tried a heating pad, icy hot and ASA without imrpovement. He denies having prior episodes of similar symptoms. He denies chest pain, SOB, fever, rash, itching, urinary symptoms, back pain and abdominal pain as associated symptoms.  He has a h/o COPD, CAD and DM. Wife reports that the pt's last heart catheterization showed an improvement in the blockage. Pt is a former  smoker but denies alcohol use.  Dr. Manus Gunning is PCP. Dr. Garnette Scheuermann is Cardiologist. Dr. Dorene Grebe is Orthopedist.   Past Medical History  Diagnosis Date  . COPD (chronic obstructive pulmonary disease)   . Asbestos exposure   . Abnormal chest x-ray   . CAD (coronary artery disease)   . DM (diabetes mellitus)   . GERD (gastroesophageal reflux disease)     Past Surgical History  Procedure Date  . Inguinal hernia repair   . Cardiac catheterization   . Cholecystectomy   . Cataracts   . Cardiac catheterization     Family History  Problem Relation Age of Onset  . Heart disease Father   . Lung cancer Father     DIED OF LUNG CANCER    History  Substance Use Topics  . Smoking status: Former Smoker -- 2.0 packs/day for 50 years    Quit date: 04/02/2001  . Smokeless tobacco: Never Used  . Alcohol Use: No     QUIT  lives with spouse    Review of Systems  Musculoskeletal:       Left upper arm pain  All other systems reviewed and are negative.    Allergies  Codeine and Morphine  Home Medications   Current Outpatient Rx  Name Route Sig Dispense Refill  . ALBUTEROL SULFATE HFA 108 (90 BASE) MCG/ACT IN AERS Inhalation Inhale 2 puffs into the lungs every  6 (six) hours as needed.      . ALBUTEROL SULFATE (2.5 MG/3ML) 0.083% IN NEBU Nebulization Take 2.5 mg by nebulization 4 (four) times daily. As needed for wheezing, shortness of breath    . ASPIRIN 81 MG PO TABS Oral Take 81 mg by mouth daily.      Marland Kitchen FLUTICASONE-SALMETEROL 250-50 MCG/DOSE IN AEPB Inhalation Inhale 1 puff into the lungs every 12 (twelve) hours.      Marland Kitchen GLIPIZIDE 5 MG PO TABS Oral Take 5 mg by mouth 2 (two) times daily before a meal.      . HYDROCHLOROTHIAZIDE 25 MG PO TABS Oral Take 25 mg by mouth daily.      . ISOSORBIDE MONONITRATE ER 60 MG PO TB24 Oral Take 60 mg by mouth daily.      Marland Kitchen LISINOPRIL 5 MG PO TABS Oral Take 5 mg by mouth daily.      Marland Kitchen METFORMIN HCL 1000 MG PO TABS Oral Take 1,000 mg by  mouth 2 (two) times daily with a meal.      . NITROGLYCERIN 0.4 MG SL SUBL Sublingual Place 0.4 mg under the tongue every 5 (five) minutes as needed. For chest pain. Do not exceed 3 pills in 15 minutes    . ROSUVASTATIN CALCIUM 5 MG PO TABS Oral Take 5 mg by mouth daily.      . TRAVOPROST 0.004 % OP SOLN  1 drop at bedtime.        Triage Vitals: BP 139/62  Pulse 87  Temp 98.2 F (36.8 C) (Oral)  Resp 18  Ht 5\' 8"  (1.727 m)  Wt 166 lb (75.297 kg)  BMI 25.24 kg/m2  SpO2 95%  Vital signs normal    Physical Exam  Nursing note and vitals reviewed. Constitutional: He is oriented to person, place, and time. He appears well-developed and well-nourished. No distress.  HENT:  Head: Normocephalic and atraumatic.  Right Ear: External ear normal.  Left Ear: External ear normal.  Nose: Nose normal.  Eyes: Conjunctivae and EOM are normal. Pupils are equal, round, and reactive to light.  Neck: Normal range of motion. Neck supple. No tracheal deviation present.  Pulmonary/Chest: Effort normal. No respiratory distress.  Abdominal: Soft. There is no tenderness.  Musculoskeletal: He exhibits tenderness. He exhibits no edema.       Decreased ROM in the left shoulder and elbow flexion secondary to pain in his medial left upper arm, no obvious deformity or crepitance, equal grip strength, pain elicited upon wrist flexion in his medial upper arm. Pt has pain and limited abduction of his left shoulder b/o pain in his mid left medial upper arm. Nontender over the clavicle, shoulder joint or deltoid.   Neurological: He is alert and oriented to person, place, and time. No cranial nerve deficit.       Grips strong bilaterally.  Skin: Skin is warm and dry.  Psychiatric: He has a normal mood and affect. His behavior is normal.    ED Course  Procedures (including critical care time)  DIAGNOSTIC STUDIES: Oxygen Saturation is 95% on room air, adequate by my interpretation.    COORDINATION OF  CARE: 10:51AM-Informed pt of normal EKG. Discussed treatment plan which includes a left arm xray, a sling, pain medication and a referral to Dr. August Saucer with pt and pt agreed to plan.  11:35AM-Pt rechecked and feels better. Informed pt of negative radiology report and pt acknowledged result. Discussed discharge plan with pt and pt agreed to plan.  Medications  HYDROcodone-acetaminophen (NORCO) 5-325 MG per tablet 2 tablet (2 tablet Oral Given 09/29/11 1059)    Dg Humerus Left  09/29/2011  *RADIOLOGY REPORT*  Clinical Data: Left upper arm pain  LEFT HUMERUS - 2+ VIEW  Comparison: None  Findings: There is no evidence of fracture or dislocation.  There is no evidence of arthropathy or other focal bone abnormality. Soft tissues are unremarkable.  IMPRESSION: Negative examination.  Original Report Authenticated By: Rosealee Albee, M.D.    Date: 09/29/2011  Rate: 83  Rhythm: normal sinus rhythm  QRS Axis: normal  Intervals: normal  ST/T Wave abnormalities: normal  Conduction Disutrbances:none  Narrative Interpretation:   Old EKG Reviewed: unchanged from 08/16/2009   1. Left upper arm pain     New Prescriptions   HYDROCODONE-ACETAMINOPHEN (NORCO) 5-325 MG PER TABLET    Take 1 tablet by mouth every 6 (six) hours as needed for pain.   METHOCARBAMOL (ROBAXIN) 500 MG TABLET    Take 1 po TID for muscles soreness    Plan discharge  Devoria Albe, MD, FACEP   MDM   I personally performed the services described in this documentation, which was scribed in my presence. The recorded information has been reviewed and considered.  Devoria Albe, MD, Armando Gang        Ward Givens, MD 09/29/11 [id number]

## 2011-12-19 ENCOUNTER — Other Ambulatory Visit: Payer: Self-pay | Admitting: Internal Medicine

## 2012-02-07 ENCOUNTER — Encounter: Payer: Self-pay | Admitting: Internal Medicine

## 2012-02-07 ENCOUNTER — Ambulatory Visit (INDEPENDENT_AMBULATORY_CARE_PROVIDER_SITE_OTHER)
Admission: RE | Admit: 2012-02-07 | Discharge: 2012-02-07 | Disposition: A | Discharge status: Home / Self Care | Payer: Medicare Other | Source: Ambulatory Visit | Attending: Internal Medicine | Admitting: Internal Medicine

## 2012-02-07 ENCOUNTER — Ambulatory Visit (INDEPENDENT_AMBULATORY_CARE_PROVIDER_SITE_OTHER): Payer: Medicare Other | Admitting: Internal Medicine

## 2012-02-07 VITALS — BP 118/82 | HR 79 | Ht 68.0 in | Wt 172.8 lb

## 2012-02-07 DIAGNOSIS — Z77090 Contact with and (suspected) exposure to asbestos: Secondary | ICD-10-CM

## 2012-02-07 DIAGNOSIS — J449 Chronic obstructive pulmonary disease, unspecified: Secondary | ICD-10-CM

## 2012-02-07 NOTE — Progress Notes (Signed)
02/07/11- 78 yoM former smoker followed for COPD with history of asbestos exposure, complicated by CAD, DM, GERD, wife here. LOV-02/09/2010 He says he is "fine". Uses Advair once daily and his nebulizer machine up to 3 times daily. Carries his rescue inhaler but rarely uses it. Cough is usually productive but only white. He denies chest pain. Uses a riding mower to [employer] his own lawn. Chest x-ray: 02/09/2010-COPD and old pleural plaques reflecting asbestos exposure. No new process. I discussed with him the difference between pleural plaques and mesothelioma.  02/07/12- 37 yoM former smoker followed for COPD with history of asbestos exposure, complicated by CAD, DM, GERD, wife here. Had flu vaccine. Reports doing well with rare need for rescue inhaler as long as he uses his nebulizer twice daily. Paces himself. No acute problems. COPD assessment test (CAT) score 15/40 CXR 02/14/11-reviewed with him IMPRESSION:  Emphysematous and chronic bronchitic changes with again identified  calcified pleural plaque disease compatible with asbestos exposure.  No definite acute process identified.  Original Report Authenticated By: Lollie Marrow, M.D.   ROS, see HPI Constitutional:   No-   weight loss, night sweats, fevers, chills, fatigue, lassitude. HEENT:   No-  headaches, difficulty swallowing, tooth/dental problems, sore throat,       No-  sneezing, itching, ear ache, nasal congestion, post nasal drip,  CV:  No-   chest pain, orthopnea, PND, swelling in lower extremities, anasarca, dizziness, palpitations Resp: No- acute  shortness of breath with exertion or at rest.              No-   productive cough,  No non-productive cough,  No- coughing up of blood.              No-   change in color of mucus.  No- wheezing.   Skin: No-   rash or lesions. GI:  No-   heartburn, indigestion, abdominal pain, nausea, vomiting,  GU:  MS:  No-   joint pain or swelling.   Neuro-     nothing unusual Psych:  No- change in  mood or affect. No depression or anxiety.  No memory loss.  OBJ General- Alert, Oriented, Affect-appropriate, Distress- none acute Skin- rash-none, lesions- none, excoriation- none Lymphadenopathy- none Head- atraumatic            Eyes- Gross vision intact, PERRLA, conjunctivae clear secretions            Ears- Hearing, canals-normal            Nose- Clear, no-Septal dev, mucus, polyps, erosion, perforation             Throat- Mallampati II , mucosa clear , drainage- none, tonsils- atrophic Neck- flexible , trachea midline, no stridor , thyroid nl, carotid no bruit Chest - symmetrical excursion , unlabored           Heart/CV- RRR , no murmur , no gallop  , no rub, nl s1 s2                           - JVD- none , edema- none, stasis changes- none, varices- none           Lung- clear to P&A, wheeze- none, cough- none , dullness-none, rub- none           Chest wall-  Abd- tender-no, distended-no, bowel sounds-present, HSM- no Br/ Gen/ Rectal- Not done, not indicated Extrem- cyanosis- none, clubbing, none, atrophy- none,  strength- nl Neuro- grossly intact to observation

## 2012-02-07 NOTE — Patient Instructions (Addendum)
Order- CXR-  Dx COPD, hx asbestos exposure  Please call as needed

## 2012-02-08 ENCOUNTER — Other Ambulatory Visit: Payer: Self-pay | Admitting: *Deleted

## 2012-02-08 MED ORDER — ALBUTEROL SULFATE (2.5 MG/3ML) 0.083% IN NEBU: 2.5000 mg | INHALATION_SOLUTION | Freq: Four times a day (QID) | RESPIRATORY_TRACT | Status: DC | PRN

## 2012-02-12 ENCOUNTER — Telehealth: Payer: Self-pay | Admitting: *Deleted

## 2012-02-12 NOTE — Telephone Encounter (Signed)
Patient called and requested cxr results.  Results per Dr. Maple Hudson given and nothing further needed at this time.  Result Notes     Notes Recorded by Waymon Budge, MD on 02/07/2012 at 1:00 PM CXR- no new process. Changes of COPD and old asbestos exposure

## 2012-02-14 NOTE — Progress Notes (Signed)
Quick Note:  Pt aware of results. ______ 

## 2012-02-17 NOTE — Assessment & Plan Note (Signed)
Plan-we are updating chest x-ray

## 2012-02-17 NOTE — Assessment & Plan Note (Signed)
Well controlled. Plan-chest x-ray update

## 2013-02-06 ENCOUNTER — Ambulatory Visit (INDEPENDENT_AMBULATORY_CARE_PROVIDER_SITE_OTHER): Payer: Medicare Other | Admitting: Internal Medicine

## 2013-02-06 ENCOUNTER — Ambulatory Visit (INDEPENDENT_AMBULATORY_CARE_PROVIDER_SITE_OTHER)
Admission: RE | Admit: 2013-02-06 | Discharge: 2013-02-06 | Disposition: A | Discharge status: Home / Self Care | Payer: Medicare Other | Source: Ambulatory Visit | Attending: Internal Medicine | Admitting: Internal Medicine

## 2013-02-06 ENCOUNTER — Encounter: Payer: Self-pay | Admitting: Internal Medicine

## 2013-02-06 VITALS — BP 116/70 | HR 68 | Ht 68.0 in | Wt 172.4 lb

## 2013-02-06 DIAGNOSIS — J439 Emphysema, unspecified: Secondary | ICD-10-CM

## 2013-02-06 DIAGNOSIS — Z77090 Contact with and (suspected) exposure to asbestos: Secondary | ICD-10-CM

## 2013-02-06 DIAGNOSIS — J438 Other emphysema: Secondary | ICD-10-CM

## 2013-02-06 DIAGNOSIS — J441 Chronic obstructive pulmonary disease with (acute) exacerbation: Secondary | ICD-10-CM

## 2013-02-06 DIAGNOSIS — J449 Chronic obstructive pulmonary disease, unspecified: Secondary | ICD-10-CM

## 2013-02-06 MED ORDER — ALBUTEROL SULFATE (2.5 MG/3ML) 0.083% IN NEBU: 2.5000 mg | INHALATION_SOLUTION | Freq: Four times a day (QID) | RESPIRATORY_TRACT | Status: DC | PRN

## 2013-02-06 NOTE — Progress Notes (Signed)
02/07/11- 89 yoM former smoker followed for COPD with history of asbestos exposure, complicated by CAD, DM, GERD, wife here. LOV-02/09/2010 He says he is "fine". Uses Advair once daily and his nebulizer machine up to 3 times daily. Carries his rescue inhaler but rarely uses it. Cough is usually productive but only white. He denies chest pain. Uses a riding mower to [employer] his own lawn. Chest x-ray: 02/09/2010-COPD and old pleural plaques reflecting asbestos exposure. No new process. I discussed with him the difference between pleural plaques and mesothelioma.  02/07/12- 18 yoM former smoker followed for COPD with history of asbestos exposure, complicated by CAD, DM, GERD, wife here. Had flu vaccine. Reports doing well with rare need for rescue inhaler as long as he uses his nebulizer twice daily. Paces himself. No acute problems. COPD assessment test (CAT) score 15/40 CXR 02/14/11-reviewed with him IMPRESSION:  Emphysematous and chronic bronchitic changes with again identified  calcified pleural plaque disease compatible with asbestos exposure.  No definite acute process identified.  Original Report Authenticated By: Lollie Marrow, M.D.   02/06/13- 11 yoM former smoker followed for COPD with history of asbestos exposure, complicated by CAD, DM, GERD,  FOLLOWS FOR: Pt states no troubles with breathing Denies cough, wheeze, unusual dyspnea with exertion. Had flu vaccine. Using rescue inhaler 3 or 4 times per week. Denies any heart problems since last here.  ROS, see HPI Constitutional:   No-   weight loss, night sweats, fevers, chills, fatigue, lassitude. HEENT:   No-  headaches, difficulty swallowing, tooth/dental problems, sore throat,       No-  sneezing, itching, ear ache, nasal congestion, post nasal drip,  CV:  No-   chest pain, orthopnea, PND, swelling in lower extremities, anasarca, dizziness, palpitations Resp: No- acute  shortness of breath with exertion or at rest.              No-    productive cough,  No non-productive cough,  No- coughing up of blood.              No-   change in color of mucus.  No- wheezing.   Skin: No-   rash or lesions. GI:  No-   heartburn, indigestion, abdominal pain, nausea, vomiting,  GU:  MS:  No-   joint pain or swelling.   Neuro-     nothing unusual Psych:  No- change in mood or affect. No depression or anxiety.  No memory loss.  OBJ General- Alert, Oriented, Affect-appropriate, Distress- none acute Skin- rash-none, lesions- none, excoriation- none Lymphadenopathy- none Head- atraumatic            Eyes- Gross vision intact, PERRLA, conjunctivae clear secretions            Ears- Hearing, canals-normal            Nose- Clear, no-Septal dev, mucus, polyps, erosion, perforation             Throat- Mallampati II , mucosa clear , drainage- none, tonsils- atrophic Neck- flexible , trachea midline, no stridor , thyroid nl, carotid no bruit Chest - symmetrical excursion , unlabored           Heart/CV- RRR , no murmur , no gallop  , no rub, nl s1 s2                           - JVD- none , edema- none, stasis changes- none, varices- none  Lung- clear to P&A, wheeze- none, cough- none , dullness-none, rub- none           Chest wall-  Abd-  Br/ Gen/ Rectal- Not done, not indicated Extrem- cyanosis- none, clubbing, none, atrophy- none, strength- nl Neuro- grossly intact to observation

## 2013-02-06 NOTE — Patient Instructions (Addendum)
Script sent refilling nebulizer medication  Order CXR- dx COPD, asbestos exposure

## 2013-02-06 NOTE — Progress Notes (Signed)
Quick Note:  Pt aware of results. ______ 

## 2013-02-21 NOTE — Assessment & Plan Note (Signed)
Plan-chest x-ray 

## 2013-02-21 NOTE — Assessment & Plan Note (Signed)
Appropriate use of medications. No concerning change in symptoms Plan-chest x-ray, we reviewed medications and refilled as appropriate

## 2013-04-27 ENCOUNTER — Other Ambulatory Visit: Payer: Self-pay | Admitting: Interventional Cardiology

## 2013-04-27 ENCOUNTER — Telehealth: Payer: Self-pay | Admitting: *Deleted

## 2013-04-27 NOTE — Telephone Encounter (Signed)
Patient requests a 90 day refill on isosorbide to be sent to [facility]. Thanks, MI

## 2013-04-28 NOTE — Telephone Encounter (Signed)
SEE TELEPHONE NOTE

## 2013-05-26 ENCOUNTER — Telehealth: Payer: Self-pay | Admitting: Internal Medicine

## 2013-05-26 DIAGNOSIS — J449 Chronic obstructive pulmonary disease, unspecified: Secondary | ICD-10-CM

## 2013-05-26 NOTE — Telephone Encounter (Signed)
Pt aware that order has been placed to [facility] to send to [facility].

## 2013-09-15 NOTE — Patient Instructions (Signed)
Your procedure is scheduled on:  09/21/13  Report to [facility] at 08:00 AM.  Call this number if you have problems the morning of surgery: [phone]   Remember:   Do not eat food or drink liquids after midnight.   Take these medicines the morning of surgery with A SIP OF WATER: Hydrochlorothiazide, Isosorbide and Lisinopril. Use your Symbicort and Albuterol inhaler.   Do not wear jewelry, make-up or nail polish.  Do not wear lotions, powders, or perfumes. You may wear deodorant.  Do not bring valuables to the hospital.  [facility] is not responsible for any belongings or valuables.               Contacts, dentures or bridgework may not be worn into surgery.               Patients discharged the day of surgery will not be allowed to drive home.   Special Instructions: Start using your eye drops prior to surgery as directed by your eye doctor.   Please read over the following fact sheets that you were given: Anesthesia Post-op Instructions and Care and Recovery After Surgery     Cataract Surgery  A cataract is a clouding of the lens of the eye. When a lens becomes cloudy, vision is reduced based on the degree and nature of the clouding. Surgery may be needed to improve vision. Surgery removes the cloudy lens and usually replaces it with a substitute lens (intraocular lens, IOL). LET YOUR EYE DOCTOR KNOW ABOUT:  Allergies to food or medicine.  Medicines taken including herbs, eyedrops, over-the-counter medicines, and creams.  Use of steroids (by mouth or creams).  Previous problems with anesthetics or numbing medicine.  History of bleeding problems or blood clots.  Previous surgery.  Other health problems, including diabetes and kidney problems.  Possibility of pregnancy, if this applies. RISKS AND COMPLICATIONS  Infection.  Inflammation of the eyeball (endophthalmitis) that can spread to both eyes (sympathetic ophthalmia).  Poor wound healing.  If an IOL is  inserted, it can later fall out of proper position. This is very uncommon.  Clouding of the part of your eye that holds an IOL in place. This is called an "after-cataract." These are uncommon, but easily treated. BEFORE THE PROCEDURE  Do not eat or drink anything except small amounts of water for 8 to 12 before your surgery, or as directed by your caregiver.  Unless you are told otherwise, continue any eyedrops you have been prescribed.  Talk to your primary caregiver about all other medicines that you take (both prescription and non-prescription). In some cases, you may need to stop or change medicines near the time of your surgery. This is most important if you are taking blood-thinning medicine.Do not stop medicines unless you are told to do so.  Arrange for someone to drive you to and from the procedure.  Do not put contact lenses in either eye on the day of your surgery. PROCEDURE There is more than one method for safely removing a cataract. Your doctor can explain the differences and help determine which is best for you. Phacoemulsification surgery is the most common form of cataract surgery.  An injection is given behind the eye or eyedrops are given to make this a painless procedure.  A small cut (incision) is made on the edge of the clear, dome-shaped surface that covers the front of the eye (cornea).  A tiny probe is painlessly inserted into the eye. This device gives  off ultrasound waves that soften and break up the cloudy center of the lens. This makes it easier for the cloudy lens to be removed by suction.  An IOL may be implanted.  The normal lens of the eye is covered by a clear capsule. Part of that capsule is intentionally left in the eye to support the IOL.  Your surgeon may or may not use stitches to close the incision. There are other forms of cataract surgery that require a larger incision and stiches to close the eye. This approach is taken in cases where the  doctor feels that the cataract cannot be easily removed using phacoemulsification. AFTER THE PROCEDURE  When an IOL is implanted, it does not need care. It becomes a permanent part of your eye and cannot be seen or felt.  Your doctor will schedule follow-up exams to check on your progress.  Review your other medicines with your doctor to see which can be resumed after surgery.  Use eyedrops or take medicine as prescribed by your doctor. Document Released: 03/08/2011 Document Revised: 06/11/2011 Document Reviewed: 03/08/2011 [facility] Patient Information 2013 [place].    PATIENT INSTRUCTIONS POST-ANESTHESIA  IMMEDIATELY FOLLOWING SURGERY:  Do not drive or operate machinery for the first twenty four hours after surgery.  Do not make any important decisions for twenty four hours after surgery or while taking narcotic pain medications or sedatives.  If you develop intractable nausea and vomiting or a severe headache please notify your doctor immediately.  FOLLOW-UP:  Please make an appointment with your surgeon as instructed. You do not need to follow up with anesthesia unless specifically instructed to do so.  WOUND CARE INSTRUCTIONS (if applicable):  Keep a dry clean dressing on the anesthesia/puncture wound site if there is drainage.  Once the wound has quit draining you may leave it open to air.  Generally you should leave the bandage intact for twenty four hours unless there is drainage.  If the epidural site drains for more than 36-48 hours please call the anesthesia department.  QUESTIONS?:  Please feel free to call your physician or the hospital operator if you have any questions, and they will be happy to assist you.

## 2013-09-17 ENCOUNTER — Other Ambulatory Visit: Payer: Self-pay

## 2013-09-17 ENCOUNTER — Encounter (HOSPITAL_COMMUNITY)
Admission: RE | Admit: 2013-09-17 | Discharge: 2013-09-17 | Disposition: A | Discharge status: Home / Self Care | Payer: Medicare Other | Source: Ambulatory Visit | Attending: Ophthalmology | Admitting: Ophthalmology

## 2013-09-17 ENCOUNTER — Encounter (HOSPITAL_COMMUNITY): Payer: Self-pay

## 2013-09-17 ENCOUNTER — Encounter (HOSPITAL_COMMUNITY): Payer: Self-pay | Admitting: Pharmacy Technician

## 2013-09-17 DIAGNOSIS — Z01812 Encounter for preprocedural laboratory examination: Secondary | ICD-10-CM | POA: Insufficient documentation

## 2013-09-17 DIAGNOSIS — Z01810 Encounter for preprocedural cardiovascular examination: Secondary | ICD-10-CM | POA: Insufficient documentation

## 2013-09-17 LAB — BASIC METABOLIC PANEL
BUN: 27 mg/dL — AB (ref 6–23)
CALCIUM: 9.9 mg/dL (ref 8.4–10.5)
CHLORIDE: 96 meq/L (ref 96–112)
CO2: 28 meq/L (ref 19–32)
CREATININE: 1.32 mg/dL (ref 0.50–1.35)
GFR calc Af Amer: 58 mL/min — ABNORMAL LOW (ref >90)
GFR calc non Af Amer: 50 mL/min — ABNORMAL LOW (ref >90)
GLUCOSE: 234 mg/dL — AB (ref 70–99)
Potassium: 4.6 mEq/L (ref 3.7–5.3)
Sodium: 137 mEq/L (ref 137–147)

## 2013-09-17 LAB — HEMOGLOBIN AND HEMATOCRIT, BLOOD
HCT: 40.3 % (ref 39.0–52.0)
Hemoglobin: 13.8 g/dL (ref 13.0–17.0)

## 2013-09-21 ENCOUNTER — Encounter (HOSPITAL_COMMUNITY): Payer: Medicare Other | Admitting: Anesthesiology

## 2013-09-21 ENCOUNTER — Encounter (HOSPITAL_COMMUNITY)
Admission: RE | Disposition: A | Discharge status: Home / Self Care | Payer: Self-pay | Source: Ambulatory Visit | Attending: Ophthalmology

## 2013-09-21 ENCOUNTER — Ambulatory Visit (HOSPITAL_COMMUNITY): Payer: Medicare Other | Admitting: Anesthesiology

## 2013-09-21 ENCOUNTER — Ambulatory Visit (HOSPITAL_COMMUNITY)
Admission: RE | Admit: 2013-09-21 | Discharge: 2013-09-21 | Disposition: A | Discharge status: Home / Self Care | Payer: Medicare Other | Source: Ambulatory Visit | Attending: Ophthalmology | Admitting: Ophthalmology

## 2013-09-21 ENCOUNTER — Encounter (HOSPITAL_COMMUNITY): Payer: Self-pay | Admitting: *Deleted

## 2013-09-21 DIAGNOSIS — J449 Chronic obstructive pulmonary disease, unspecified: Secondary | ICD-10-CM | POA: Insufficient documentation

## 2013-09-21 DIAGNOSIS — Z87891 Personal history of nicotine dependence: Secondary | ICD-10-CM | POA: Insufficient documentation

## 2013-09-21 DIAGNOSIS — I2510 Atherosclerotic heart disease of native coronary artery without angina pectoris: Secondary | ICD-10-CM | POA: Insufficient documentation

## 2013-09-21 DIAGNOSIS — E119 Type 2 diabetes mellitus without complications: Secondary | ICD-10-CM | POA: Insufficient documentation

## 2013-09-21 DIAGNOSIS — Z7982 Long term (current) use of aspirin: Secondary | ICD-10-CM | POA: Insufficient documentation

## 2013-09-21 DIAGNOSIS — H4011X0 Primary open-angle glaucoma, stage unspecified: Secondary | ICD-10-CM | POA: Insufficient documentation

## 2013-09-21 DIAGNOSIS — K219 Gastro-esophageal reflux disease without esophagitis: Secondary | ICD-10-CM | POA: Insufficient documentation

## 2013-09-21 DIAGNOSIS — Z961 Presence of intraocular lens: Secondary | ICD-10-CM | POA: Insufficient documentation

## 2013-09-21 DIAGNOSIS — H2589 Other age-related cataract: Secondary | ICD-10-CM | POA: Insufficient documentation

## 2013-09-21 DIAGNOSIS — J4489 Other specified chronic obstructive pulmonary disease: Secondary | ICD-10-CM | POA: Insufficient documentation

## 2013-09-21 DIAGNOSIS — H409 Unspecified glaucoma: Secondary | ICD-10-CM | POA: Insufficient documentation

## 2013-09-21 HISTORY — PX: CATARACT EXTRACTION W/PHACO: SHX586

## 2013-09-21 LAB — GLUCOSE, CAPILLARY: GLUCOSE-CAPILLARY: 184 mg/dL — AB (ref 70–99)

## 2013-09-21 SURGERY — PHACOEMULSIFICATION, CATARACT, WITH IOL INSERTION
Anesthesia: Monitor Anesthesia Care | Site: Eye | Laterality: Left

## 2013-09-21 MED ORDER — MIDAZOLAM HCL 2 MG/2ML IJ SOLN
INTRAMUSCULAR | Status: AC
  Filled 2013-09-21: qty 2

## 2013-09-21 MED ORDER — TETRACAINE HCL 0.5 % OP SOLN
1.0000 [drp] | OPHTHALMIC | Status: AC
  Administered 2013-09-21 (×3): 1 [drp] via OPHTHALMIC

## 2013-09-21 MED ORDER — FENTANYL CITRATE 0.05 MG/ML IJ SOLN
INTRAMUSCULAR | Status: AC
  Filled 2013-09-21: qty 2

## 2013-09-21 MED ORDER — POVIDONE-IODINE 5 % OP SOLN
OPHTHALMIC | Status: DC | PRN
  Administered 2013-09-21: 1 via OPHTHALMIC

## 2013-09-21 MED ORDER — FENTANYL CITRATE 0.05 MG/ML IJ SOLN
25.0000 ug | INTRAMUSCULAR | Status: AC
  Administered 2013-09-21: 25 ug via INTRAVENOUS

## 2013-09-21 MED ORDER — PHENYLEPHRINE HCL 2.5 % OP SOLN
1.0000 [drp] | OPHTHALMIC | Status: AC
  Administered 2013-09-21 (×3): 1 [drp] via OPHTHALMIC

## 2013-09-21 MED ORDER — TETRACAINE HCL 0.5 % OP SOLN
OPHTHALMIC | Status: AC
  Filled 2013-09-21: qty 2

## 2013-09-21 MED ORDER — LIDOCAINE 3.5 % OP GEL OPTIME - NO CHARGE
OPHTHALMIC | Status: DC | PRN
  Administered 2013-09-21: 2 [drp] via OPHTHALMIC

## 2013-09-21 MED ORDER — BSS IO SOLN
INTRAOCULAR | Status: DC | PRN
  Administered 2013-09-21: 15 mL via INTRAOCULAR

## 2013-09-21 MED ORDER — MIDAZOLAM HCL 2 MG/2ML IJ SOLN
1.0000 mg | INTRAMUSCULAR | Status: DC | PRN
  Administered 2013-09-21 (×2): 2 mg via INTRAVENOUS
  Filled 2013-09-21: qty 2

## 2013-09-21 MED ORDER — EPINEPHRINE HCL 1 MG/ML IJ SOLN
INTRAMUSCULAR | Status: AC
  Filled 2013-09-21: qty 1

## 2013-09-21 MED ORDER — PROVISC 10 MG/ML IO SOLN
INTRAOCULAR | Status: DC | PRN
  Administered 2013-09-21: 0.85 mL via INTRAOCULAR

## 2013-09-21 MED ORDER — LIDOCAINE HCL (PF) 1 % IJ SOLN
INTRAMUSCULAR | Status: AC
  Filled 2013-09-21: qty 2

## 2013-09-21 MED ORDER — LACTATED RINGERS IV SOLN
INTRAVENOUS | Status: DC
  Administered 2013-09-21: 09:00:00 via INTRAVENOUS

## 2013-09-21 MED ORDER — ONDANSETRON HCL 4 MG/2ML IJ SOLN: 4.0000 mg | Freq: Once | INTRAMUSCULAR | Status: DC | PRN

## 2013-09-21 MED ORDER — CYCLOPENTOLATE-PHENYLEPHRINE 0.2-1 % OP SOLN
1.0000 [drp] | OPHTHALMIC | Status: AC
  Administered 2013-09-21 (×3): 1 [drp] via OPHTHALMIC

## 2013-09-21 MED ORDER — LIDOCAINE HCL (PF) 1 % IJ SOLN
INTRAMUSCULAR | Status: DC | PRN
  Administered 2013-09-21: .5 mL

## 2013-09-21 MED ORDER — LIDOCAINE HCL 3.5 % OP GEL
1.0000 | Freq: Once | OPHTHALMIC | Status: DC
Start: 2013-09-21 — End: 2013-09-21

## 2013-09-21 MED ORDER — PHENYLEPHRINE HCL 2.5 % OP SOLN
OPHTHALMIC | Status: AC
  Filled 2013-09-21: qty 15

## 2013-09-21 MED ORDER — CYCLOPENTOLATE-PHENYLEPHRINE OP SOLN OPTIME - NO CHARGE
OPHTHALMIC | Status: AC
  Filled 2013-09-21: qty 2

## 2013-09-21 MED ORDER — LIDOCAINE HCL 3.5 % OP GEL
OPHTHALMIC | Status: AC
  Filled 2013-09-21: qty 1

## 2013-09-21 MED ORDER — NEOMYCIN-POLYMYXIN-DEXAMETH 3.5-10000-0.1 OP SUSP
OPHTHALMIC | Status: AC
  Filled 2013-09-21: qty 5

## 2013-09-21 MED ORDER — EPINEPHRINE HCL 1 MG/ML IJ SOLN
INTRAOCULAR | Status: DC | PRN
  Administered 2013-09-21: 10:00:00

## 2013-09-21 MED ORDER — FENTANYL CITRATE 0.05 MG/ML IJ SOLN: 25.0000 ug | INTRAMUSCULAR | Status: DC | PRN

## 2013-09-21 MED ORDER — NEOMYCIN-POLYMYXIN-DEXAMETH 3.5-10000-0.1 OP SUSP
OPHTHALMIC | Status: DC | PRN
  Administered 2013-09-21: 2 [drp] via OPHTHALMIC

## 2013-09-21 SURGICAL SUPPLY — 34 items
CAPSULAR TENSION RING-AMO (OPHTHALMIC RELATED) IMPLANT
CLOTH BEACON ORANGE TIMEOUT ST (SAFETY) ×2 IMPLANT
EYE SHIELD UNIVERSAL CLEAR (GAUZE/BANDAGES/DRESSINGS) ×2 IMPLANT
GLOVE BIO SURGEON STRL SZ 6.5 (GLOVE) IMPLANT
GLOVE BIO SURGEONS STRL SZ 6.5 (GLOVE)
GLOVE BIOGEL PI IND STRL 6.5 (GLOVE) IMPLANT
GLOVE BIOGEL PI IND STRL 7.0 (GLOVE) IMPLANT
GLOVE BIOGEL PI IND STRL 7.5 (GLOVE) IMPLANT
GLOVE BIOGEL PI INDICATOR 6.5 (GLOVE) ×2
GLOVE BIOGEL PI INDICATOR 7.0 (GLOVE)
GLOVE BIOGEL PI INDICATOR 7.5 (GLOVE)
GLOVE ECLIPSE 6.5 STRL STRAW (GLOVE) IMPLANT
GLOVE ECLIPSE 7.0 STRL STRAW (GLOVE) IMPLANT
GLOVE ECLIPSE 7.5 STRL STRAW (GLOVE) IMPLANT
GLOVE EXAM NITRILE LRG STRL (GLOVE) IMPLANT
GLOVE EXAM NITRILE MD LF STRL (GLOVE) IMPLANT
GLOVE SKINSENSE NS SZ6.5 (GLOVE)
GLOVE SKINSENSE NS SZ7.0 (GLOVE)
GLOVE SKINSENSE STRL SZ6.5 (GLOVE) IMPLANT
GLOVE SKINSENSE STRL SZ7.0 (GLOVE) IMPLANT
GLOVE SS N UNI LF 7.0 STRL (GLOVE) ×2 IMPLANT
KIT VITRECTOMY (OPHTHALMIC RELATED) IMPLANT
PAD ARMBOARD 7.5X6 YLW CONV (MISCELLANEOUS) ×2 IMPLANT
PROC W NO LENS (INTRAOCULAR LENS)
PROC W SPEC LENS (INTRAOCULAR LENS)
PROCESS W NO LENS (INTRAOCULAR LENS) IMPLANT
PROCESS W SPEC LENS (INTRAOCULAR LENS) IMPLANT
RING MALYGIN (MISCELLANEOUS) IMPLANT
SIGHTPATH CAT PROC W REG LENS (Ophthalmic Related) ×3 IMPLANT
SYR TB 1ML LL NO SAFETY (SYRINGE) ×2 IMPLANT
TAPE SURG TRANSPORE 1 IN (GAUZE/BANDAGES/DRESSINGS) IMPLANT
TAPE SURGICAL TRANSPORE 1 IN (GAUZE/BANDAGES/DRESSINGS) ×2
VISCOELASTIC ADDITIONAL (OPHTHALMIC RELATED) IMPLANT
WATER STERILE IRR 250ML POUR (IV SOLUTION) ×2 IMPLANT

## 2013-09-21 NOTE — Anesthesia Postprocedure Evaluation (Signed)
  Anesthesia Post-op Note  Patient: Stephen [name]  Procedure(s) Performed: Procedure(s) (LRB): CATARACT EXTRACTION PHACO AND INTRAOCULAR LENS PLACEMENT (IOC) (Left)  Patient Location:  Short Stay  Anesthesia Type: MAC  Level of Consciousness: awake  Airway and Oxygen Therapy: Patient Spontanous Breathing  Post-op Pain: none  Post-op Assessment: Post-op Vital signs reviewed, Patient's Cardiovascular Status Stable, Respiratory Function Stable, Patent Airway, No signs of Nausea or vomiting and Pain level controlled  Post-op Vital Signs: Reviewed and stable  Complications: No apparent anesthesia complications

## 2013-09-21 NOTE — H&P (Signed)
I have reviewed the H&P, the patient was re-examined, and I have identified no interval changes in medical condition and plan of care since the history and physical of record  

## 2013-09-21 NOTE — Op Note (Signed)
Date of Admission: 09/21/2013  Date of Surgery: 09/21/2013   Pre-Op Dx: Cataract Left Eye  Post-Op Dx: Cataract Left  Eye,  Dx Code 366.19  Surgeon: Tonny Branch, M.D.  Assistants: None  Anesthesia: Topical with MAC  Indications: Painless, progressive loss of vision with compromise of daily activities.  Surgery: Cataract Extraction with Intraocular lens Implant Left Eye  Discription: The patient had dilating drops and viscous lidocaine placed into the Left eye in the pre-op holding area. After transfer to the operating room, a time out was performed. The patient was then prepped and draped. Beginning with a 68 degree blade a paracentesis port was made at the surgeon's 2 o'clock position. The anterior chamber was then filled with 1% non-preserved lidocaine. This was followed by filling the anterior chamber with Provisc.  A 2.90mm keratome blade was used to make a clear corneal incision at the temporal limbus.  A bent cystatome needle was used to create a continuous tear capsulotomy. Hydrodissection was performed with balanced salt solution on a Fine canula. The lens nucleus was then removed using the phacoemulsification handpiece. Residual cortex was removed with the I&A handpiece. The anterior chamber and capsular bag were refilled with Provisc. A posterior chamber intraocular lens was placed into the capsular bag with it's injector. The implant was positioned with the Kuglan hook. The Provisc was then removed from the anterior chamber and capsular bag with the I&A handpiece. Stromal hydration of the main incision and paracentesis port was performed with BSS on a Fine canula. The wounds were tested for leak which was negative. The patient tolerated the procedure well. There were no operative complications. The patient was then transferred to the recovery room in stable condition.  Complications: None  Specimen: None  EBL: None  Prosthetic device: Hoya iSert 250, power 20.5 D, SN [id number].

## 2013-09-21 NOTE — Anesthesia Procedure Notes (Signed)
Procedure Name: MAC Date/Time: 09/21/2013 9:29 AM Performed by: Vista Deck Pre-anesthesia Checklist: Patient identified, Emergency Drugs available, Suction available, Timeout performed and Patient being monitored Patient Re-evaluated:Patient Re-evaluated prior to inductionOxygen Delivery Method: Nasal Cannula

## 2013-09-21 NOTE — Transfer of Care (Signed)
Immediate Anesthesia Transfer of Care Note  Patient: Stephen [name]  Procedure(s) Performed: Procedure(s) (LRB): CATARACT EXTRACTION PHACO AND INTRAOCULAR LENS PLACEMENT (IOC) (Left)  Patient Location: Shortstay  Anesthesia Type: MAC  Level of Consciousness: awake  Airway & Oxygen Therapy: Patient Spontanous Breathing   Post-op Assessment: Report given to PACU RN, Post -op Vital signs reviewed and stable and Patient moving all extremities  Post vital signs: Reviewed and stable  Complications: No apparent anesthesia complications

## 2013-09-21 NOTE — Anesthesia Preprocedure Evaluation (Signed)
Anesthesia Evaluation  Patient identified by MRN, date of birth, ID band Patient awake    Reviewed: Allergy & Precautions, H&P , NPO status , Patient's Chart, lab work & pertinent test results  Airway Mallampati: II TM Distance: >3 FB     Dental  (+) Edentulous Lower, Edentulous Upper   Pulmonary shortness of breath, COPDformer smoker,  breath sounds clear to auscultation        Cardiovascular + CAD Rhythm:Regular     Neuro/Psych    GI/Hepatic GERD-  Medicated and Controlled,  Endo/Other  diabetes, Type 2, Oral Hypoglycemic Agents  Renal/GU      Musculoskeletal   Abdominal   Peds  Hematology   Anesthesia Other Findings   Reproductive/Obstetrics                           Anesthesia Physical Anesthesia Plan  ASA: III  Anesthesia Plan: MAC   Post-op Pain Management:    Induction: Intravenous  Airway Management Planned: Nasal Cannula  Additional Equipment:   Intra-op Plan:   Post-operative Plan:   Informed Consent: I have reviewed the patients History and Physical, chart, labs and discussed the procedure including the risks, benefits and alternatives for the proposed anesthesia with the patient or authorized representative who has indicated his/her understanding and acceptance.     Plan Discussed with:   Anesthesia Plan Comments:         Anesthesia Quick Evaluation

## 2013-09-22 ENCOUNTER — Encounter (HOSPITAL_COMMUNITY): Payer: Self-pay | Admitting: Ophthalmology

## 2013-10-20 ENCOUNTER — Other Ambulatory Visit: Payer: Self-pay | Admitting: Interventional Cardiology

## 2013-12-03 ENCOUNTER — Encounter (HOSPITAL_COMMUNITY): Payer: Self-pay | Admitting: Emergency Medicine

## 2013-12-03 ENCOUNTER — Emergency Department (HOSPITAL_COMMUNITY): Payer: Medicare Other

## 2013-12-03 ENCOUNTER — Emergency Department (HOSPITAL_COMMUNITY)
Admission: EM | Admit: 2013-12-03 | Discharge: 2013-12-03 | Disposition: A | Discharge status: Home / Self Care | Payer: Medicare Other | Attending: Emergency Medicine | Admitting: Emergency Medicine

## 2013-12-03 DIAGNOSIS — Z87891 Personal history of nicotine dependence: Secondary | ICD-10-CM | POA: Diagnosis not present

## 2013-12-03 DIAGNOSIS — M5432 Sciatica, left side: Secondary | ICD-10-CM

## 2013-12-03 DIAGNOSIS — Z8719 Personal history of other diseases of the digestive system: Secondary | ICD-10-CM | POA: Insufficient documentation

## 2013-12-03 DIAGNOSIS — Z9889 Other specified postprocedural states: Secondary | ICD-10-CM | POA: Insufficient documentation

## 2013-12-03 DIAGNOSIS — M5430 Sciatica, unspecified side: Secondary | ICD-10-CM | POA: Diagnosis not present

## 2013-12-03 DIAGNOSIS — Z791 Long term (current) use of non-steroidal anti-inflammatories (NSAID): Secondary | ICD-10-CM | POA: Insufficient documentation

## 2013-12-03 DIAGNOSIS — J449 Chronic obstructive pulmonary disease, unspecified: Secondary | ICD-10-CM | POA: Diagnosis not present

## 2013-12-03 DIAGNOSIS — E119 Type 2 diabetes mellitus without complications: Secondary | ICD-10-CM | POA: Diagnosis not present

## 2013-12-03 DIAGNOSIS — M545 Low back pain, unspecified: Secondary | ICD-10-CM | POA: Insufficient documentation

## 2013-12-03 DIAGNOSIS — I2510 Atherosclerotic heart disease of native coronary artery without angina pectoris: Secondary | ICD-10-CM | POA: Diagnosis not present

## 2013-12-03 DIAGNOSIS — J4489 Other specified chronic obstructive pulmonary disease: Secondary | ICD-10-CM | POA: Insufficient documentation

## 2013-12-03 DIAGNOSIS — Z79899 Other long term (current) drug therapy: Secondary | ICD-10-CM | POA: Diagnosis not present

## 2013-12-03 DIAGNOSIS — Z7982 Long term (current) use of aspirin: Secondary | ICD-10-CM | POA: Insufficient documentation

## 2013-12-03 LAB — URINALYSIS, ROUTINE W REFLEX MICROSCOPIC
BILIRUBIN URINE: NEGATIVE
Glucose, UA: 250 mg/dL — AB
Hgb urine dipstick: NEGATIVE
KETONES UR: NEGATIVE mg/dL
Leukocytes, UA: NEGATIVE
Nitrite: NEGATIVE
PROTEIN: NEGATIVE mg/dL
Specific Gravity, Urine: 1.02 (ref 1.005–1.030)
UROBILINOGEN UA: 2 mg/dL — AB (ref 0.0–1.0)
pH: 6 (ref 5.0–8.0)

## 2013-12-03 MED ORDER — METHOCARBAMOL 500 MG PO TABS
500.0000 mg | ORAL_TABLET | Freq: Once | ORAL | Status: AC
  Administered 2013-12-03: 500 mg via ORAL
  Filled 2013-12-03: qty 1

## 2013-12-03 MED ORDER — TRAMADOL HCL 50 MG PO TABS: 50.0000 mg | ORAL_TABLET | Freq: Four times a day (QID) | ORAL | Status: DC | PRN

## 2013-12-03 MED ORDER — METHOCARBAMOL 500 MG PO TABS: 500.0000 mg | ORAL_TABLET | Freq: Three times a day (TID) | ORAL | Status: DC

## 2013-12-03 MED ORDER — TRAMADOL HCL 50 MG PO TABS
50.0000 mg | ORAL_TABLET | Freq: Once | ORAL | Status: AC
  Administered 2013-12-03: 50 mg via ORAL
  Filled 2013-12-03: qty 1

## 2013-12-03 NOTE — ED Notes (Signed)
Pt alert & oriented x4, stable gait. Patient given discharge instructions, paperwork & prescription(s). Patient  instructed to stop at the registration desk to finish any additional paperwork. Patient verbalized understanding. Pt left department w/ no further questions. 

## 2013-12-03 NOTE — ED Notes (Signed)
PT c/o lower back pain x3 days with no relief from OTC medications tried at home. PT denies any injury.

## 2013-12-03 NOTE — Discharge Instructions (Signed)
Sciatica °Sciatica is pain, weakness, numbness, or tingling along your sciatic nerve. The nerve starts in the lower back and runs down the back of each leg. Nerve damage or certain conditions pinch or put pressure on the sciatic nerve. This causes the pain, weakness, and other discomforts of sciatica. °HOME CARE  °· Only take medicine as told by your doctor. °· Apply ice to the affected area for 20 minutes. Do this 3-4 times a day for the first 48-72 hours. Then try heat in the same way. °· Exercise, stretch, or do your usual activities if these do not make your pain worse. °· Go to physical therapy as told by your doctor. °· Keep all doctor visits as told. °· Do not wear high heels or shoes that are not supportive. °· Get a firm mattress if your mattress is too soft to lessen pain and discomfort. °GET HELP RIGHT AWAY IF:  °· You cannot control when you poop (bowel movement) or pee (urinate). °· You have more weakness in your lower back, lower belly (pelvis), butt (buttocks), or legs. °· You have redness or puffiness (swelling) of your back. °· You have a burning feeling when you pee. °· You have pain that gets worse when you lie down. °· You have pain that wakes you from your sleep. °· Your pain is worse than past pain. °· Your pain lasts longer than 4 weeks. °· You are suddenly losing weight without reason. °MAKE SURE YOU:  °· Understand these instructions. °· Will watch this condition. °· Will get help right away if you are not doing well or get worse. °Document Released: 12/27/2007 Document Revised: 09/18/2011 Document Reviewed: 07/29/2011 °ExitCare® Patient Information ©2015 [employer]. This information is not intended to replace advice given to you by your health care provider. Make sure you discuss any questions you have with your health care provider. ° °

## 2013-12-04 NOTE — ED Provider Notes (Signed)
CSN: [id number]     Arrival date & time 12/03/13  1443 History   First MD Initiated Contact with Patient 12/03/13 [phone]     Chief Complaint  Patient presents with  . Back Pain     (Consider location/radiation/quality/duration/timing/severity/associated sxs/prior Treatment) Patient is a 80 y.o. male presenting with back pain.  Back Pain Location:  Lumbar spine Quality:  Aching Radiates to:  L posterior upper leg and L thigh Pain severity:  Moderate Pain is:  Same all the time Onset quality:  Gradual Timing:  Constant Progression:  Worsening Chronicity:  Recurrent Context: not falling, not lifting heavy objects, not recent injury and not twisting   Relieved by:  Nothing Worsened by:  Bending, twisting and standing Ineffective treatments:  OTC medications Associated symptoms: leg pain   Associated symptoms: no abdominal pain, no abdominal swelling, no bladder incontinence, no bowel incontinence, no chest pain, no dysuria, no fever, no headaches, no numbness, no paresthesias, no pelvic pain, no perianal numbness, no tingling and no weakness      Stephen [name] is a 80 y.o. male who presents to the Emergency Department complaining of left low back pain for 3 days.  Has h/o same.    Past Medical History  Diagnosis Date  . COPD (chronic obstructive pulmonary disease)   . Asbestos exposure   . Abnormal chest x-ray   . CAD (coronary artery disease)   . DM (diabetes mellitus)   . GERD (gastroesophageal reflux disease)    Past Surgical History  Procedure Laterality Date  . Inguinal hernia repair    . Cardiac catheterization    . Cholecystectomy    . Cataracts    . Cardiac catheterization    . Cataract extraction w/phaco Left 09/21/2013    Procedure: CATARACT EXTRACTION PHACO AND INTRAOCULAR LENS PLACEMENT (IOC);  Surgeon: Tonny Branch, MD;  Location: [facility];  Service: Ophthalmology;  Laterality: Left;  CDE: 7.34   Family History  Problem Relation Age of Onset  . Heart disease  Father   . Lung cancer Father     DIED OF LUNG CANCER  . Diabetes Brother    History  Substance Use Topics  . Smoking status: Former Smoker -- 2.00 packs/day for 50 years    Quit date: 04/02/2001  . Smokeless tobacco: Never Used  . Alcohol Use: Yes     Comment: rarely    Review of Systems  Constitutional: Negative for fever.  Respiratory: Negative for shortness of breath.   Cardiovascular: Negative for chest pain.  Gastrointestinal: Negative for vomiting, abdominal pain, constipation and bowel incontinence.  Genitourinary: Negative for bladder incontinence, dysuria, hematuria, flank pain, decreased urine volume, difficulty urinating and pelvic pain.       No perineal numbness or incontinence of urine or feces  Musculoskeletal: Positive for back pain. Negative for joint swelling and neck pain.  Skin: Negative for rash.  Neurological: Negative for tingling, weakness, numbness, headaches and paresthesias.  All other systems reviewed and are negative.     Allergies  Codeine and Morphine  Home Medications   Prior to Admission medications   Medication Sig Start Date End Date Taking? Authorizing Provider  albuterol (PROVENTIL HFA;VENTOLIN HFA) 108 (90 BASE) MCG/ACT inhaler Inhale 2 puffs into the lungs every 6 (six) hours as needed.     Yes Historical Provider, MD  albuterol (PROVENTIL) (2.5 MG/3ML) 0.083% nebulizer solution Take 3 mLs (2.5 mg total) by nebulization every 6 (six) hours as needed. DX:  496  FILE MCR PART  B 02/06/13  Yes Deneise Lever, MD  glipiZIDE (GLUCOTROL) 5 MG tablet Take 5 mg by mouth 2 (two) times daily before a meal.     Yes Historical Provider, MD  hydrochlorothiazide (HYDRODIURIL) 25 MG tablet Take 25 mg by mouth at bedtime.    Yes Historical Provider, MD  isosorbide mononitrate (IMDUR) 60 MG 24 hr tablet TAKE ONE TABLET BY MOUTH EVERY DAY.   Yes Belva Crome III, MD  latanoprost (XALATAN) 0.005 % ophthalmic solution Place 1 drop into both eyes At  bedtime. 12/31/11  Yes Historical Provider, MD  lisinopril (PRINIVIL,ZESTRIL) 5 MG tablet Take 5 mg by mouth at bedtime.    Yes Historical Provider, MD  metFORMIN (GLUCOPHAGE) 1000 MG tablet Take 1,000 mg by mouth 2 (two) times daily with a meal.     Yes Historical Provider, MD  Naproxen Sodium (ALEVE) 220 MG CAPS Take 220 capsules by mouth every 12 (twelve) hours as needed (pain).   Yes Historical Provider, MD  rosuvastatin (CRESTOR) 5 MG tablet Take 5 mg by mouth 3 (three) times a week.    Yes Historical Provider, MD  aspirin EC 81 MG tablet Take 81 mg by mouth daily as needed for moderate pain.     Historical Provider, MD  methocarbamol (ROBAXIN) 500 MG tablet Take 1 tablet (500 mg total) by mouth 3 (three) times daily. 12/03/13   Darryl Willner L. Kieli Golladay, PA-C  nitroGLYCERIN (NITROSTAT) 0.4 MG SL tablet Place 0.4 mg under the tongue every 5 (five) minutes as needed. For chest pain. Do not exceed 3 pills in 15 minutes    Historical Provider, MD  SYMBICORT 160-4.5 MCG/ACT inhaler Inhale 2 puffs into the lungs 2 (two) times daily. RINSE MOUTH AFTER USE 01/27/13   Historical Provider, MD  traMADol (ULTRAM) 50 MG tablet Take 1 tablet (50 mg total) by mouth every 6 (six) hours as needed. 12/03/13   Bailen Geffre L. Storie Heffern, PA-C   BP 104/67  Pulse 76  Temp(Src) 98.7 F (37.1 C) (Oral)  Resp 20  Ht 5\' 8"  (1.727 m)  Wt 165 lb (74.844 kg)  BMI 25.09 kg/m2  SpO2 96% Physical Exam  Nursing note and vitals reviewed. Constitutional: He is oriented to person, place, and time. He appears well-developed and well-nourished. No distress.  HENT:  Head: Normocephalic and atraumatic.  Neck: Normal range of motion. Neck supple.  Cardiovascular: Normal rate, regular rhythm, normal heart sounds and intact distal pulses.   No murmur heard. Pulmonary/Chest: Effort normal and breath sounds normal. No respiratory distress.  Abdominal: Soft. He exhibits no distension. There is no tenderness. There is no rebound and no guarding.   Musculoskeletal: He exhibits tenderness. He exhibits no edema.       Lumbar back: He exhibits tenderness and pain. He exhibits normal range of motion, no swelling, no deformity, no laceration and normal pulse.  ttp of the left lumbar paraspinal muscles and SI joint.  No spinal tenderness.  DP pulses are brisk and symmetrical.  Distal sensation intact.  Hip Flexors/Extensors are intact.  Pt has 5/5 strength against resistance of bilateral lower extremities.     Neurological: He is alert and oriented to person, place, and time. He has normal strength. No sensory deficit. He exhibits normal muscle tone. Coordination and gait normal.  Reflex Scores:      Patellar reflexes are 2+ on the right side and 2+ on the left side.      Achilles reflexes are 2+ on the right side and 2+  on the left side. Skin: Skin is warm and dry. No rash noted.    ED Course  Procedures (including critical care time) Labs Review Labs Reviewed  URINALYSIS, ROUTINE W REFLEX MICROSCOPIC - Abnormal; Notable for the following:    Glucose, UA 250 (*)    Urobilinogen, UA 2.0 (*)    All other components within normal limits    Imaging Review Dg Lumbar Spine Complete  12/03/2013   CLINICAL DATA:  Severe low back pain  EXAM: LUMBAR SPINE - COMPLETE 4+ VIEW  COMPARISON:  None.  FINDINGS: Normal lumbar alignment. Negative for fracture or pars defect. Disc spaces are maintained  IMPRESSION: Negative.   Electronically Signed   By: Franchot Gallo M.D.   On: 12/03/2013 11:20     EKG Interpretation None      MDM   Final diagnoses:  Sciatica, left    Pt is well appearing, left sided sciatica with h/o same.  No concerning sx's for emergent neurological or infectious process.  ambulates with a steady gait.  Pt feeling better after medications.  He agrees to close f/u with his PMD, ultram, and robaxin.  Discussed pt and care plan with Dr. Roderic Palau .  Pt agrees to return here if sx's worsen    Aldan Camey L. Vanessa [place], PA-C 12/04/13  2054

## 2013-12-07 NOTE — ED Provider Notes (Signed)
Medical screening examination/treatment/procedure(s) were performed by non-physician practitioner and as supervising physician I was immediately available for consultation/collaboration.   EKG Interpretation None        Maudry Diego, MD 12/07/13 1348

## 2013-12-30 ENCOUNTER — Ambulatory Visit (INDEPENDENT_AMBULATORY_CARE_PROVIDER_SITE_OTHER): Payer: Medicare Other

## 2013-12-30 DIAGNOSIS — Z23 Encounter for immunization: Secondary | ICD-10-CM

## 2014-01-22 ENCOUNTER — Other Ambulatory Visit: Payer: Self-pay | Admitting: *Deleted

## 2014-01-22 MED ORDER — ISOSORBIDE MONONITRATE ER 60 MG PO TB24: ORAL_TABLET | ORAL | Status: DC

## 2014-01-26 ENCOUNTER — Encounter: Payer: Self-pay | Admitting: Internal Medicine

## 2014-01-26 ENCOUNTER — Ambulatory Visit (INDEPENDENT_AMBULATORY_CARE_PROVIDER_SITE_OTHER)
Admission: RE | Admit: 2014-01-26 | Discharge: 2014-01-26 | Disposition: A | Discharge status: Home / Self Care | Payer: Medicare Other | Source: Ambulatory Visit | Attending: Internal Medicine | Admitting: Internal Medicine

## 2014-01-26 ENCOUNTER — Ambulatory Visit (INDEPENDENT_AMBULATORY_CARE_PROVIDER_SITE_OTHER): Payer: Medicare Other | Admitting: Internal Medicine

## 2014-01-26 VITALS — BP 120/70 | HR 81 | Ht 68.0 in | Wt 169.0 lb

## 2014-01-26 DIAGNOSIS — J209 Acute bronchitis, unspecified: Secondary | ICD-10-CM

## 2014-01-26 DIAGNOSIS — J440 Chronic obstructive pulmonary disease with acute lower respiratory infection: Principal | ICD-10-CM

## 2014-01-26 DIAGNOSIS — J441 Chronic obstructive pulmonary disease with (acute) exacerbation: Secondary | ICD-10-CM

## 2014-01-26 MED ORDER — PREDNISONE 10 MG PO TABS: ORAL_TABLET | ORAL | Status: DC

## 2014-01-26 MED ORDER — HYDROCODONE-HOMATROPINE 5-1.5 MG/5ML PO SYRP: 5.0000 mL | ORAL_SOLUTION | Freq: Four times a day (QID) | ORAL | Status: DC | PRN

## 2014-01-26 NOTE — Patient Instructions (Signed)
Script sent for prednisone taper  Script printed for cough syrup  Order CXR   Dx COPD with acute bronchitis

## 2014-01-26 NOTE — Progress Notes (Signed)
02/07/11- 54 yoM former smoker followed for COPD with history of asbestos exposure, complicated by CAD, DM, GERD, wife here. LOV-02/09/2010 He says he is "fine". Uses Advair once daily and his nebulizer machine up to 3 times daily. Carries his rescue inhaler but rarely uses it. Cough is usually productive but only white. He denies chest pain. Uses a riding mower to [employer] his own lawn. Chest x-ray: 02/09/2010-COPD and old pleural plaques reflecting asbestos exposure. No new process. I discussed with him the difference between pleural plaques and mesothelioma.  02/07/12- 32 yoM former smoker followed for COPD with history of asbestos exposure, complicated by CAD, DM, GERD, wife here. Had flu vaccine. Reports doing well with rare need for rescue inhaler as long as he uses his nebulizer twice daily. Paces himself. No acute problems. COPD assessment test (CAT) score 15/40 CXR 02/14/11-reviewed with him IMPRESSION:  Emphysematous and chronic bronchitic changes with again identified  calcified pleural plaque disease compatible with asbestos exposure.  No definite acute process identified.  Original Report Authenticated By: Burnetta Sabin, M.D.   02/06/13- 81 yoM former smoker followed for COPD with history of asbestos exposure, complicated by CAD, DM, GERD,  FOLLOWS FOR: Pt states no troubles with breathing Denies cough, wheeze, unusual dyspnea with exertion. Had flu vaccine. Using rescue inhaler 3 or 4 times per week. Denies any heart problems since last here.  01/26/14- 62 yoM former smoker followed for COPD with history of asbestos exposure, complicated by CAD, DM, GERD Follow for: patient complain of cough, congestion, wheezing and chest tightness for one week and a half. Patient denies any fever.  Patient is using inhalers as directed.   ROS, see HPI Constitutional:   No-   weight loss, night sweats, fevers, chills, fatigue, lassitude. HEENT:   No-  headaches, difficulty swallowing, tooth/dental  problems, sore throat,       No-  sneezing, itching, ear ache, nasal congestion, post nasal drip,  CV:  No-   chest pain, orthopnea, PND, swelling in lower extremities, anasarca, dizziness, palpitations Resp: No- acute  shortness of breath with exertion or at rest.              No-   productive cough,  No non-productive cough,  No- coughing up of blood.              No-   change in color of mucus.  No- wheezing.   Skin: No-   rash or lesions. GI:  No-   heartburn, indigestion, abdominal pain, nausea, vomiting,  GU:  MS:  No-   joint pain or swelling.   Neuro-     nothing unusual Psych:  No- change in mood or affect. No depression or anxiety.  No memory loss.  OBJ General- Alert, Oriented, Affect-appropriate, Distress- none acute Skin- rash-none, lesions- none, excoriation- none Lymphadenopathy- none Head- atraumatic            Eyes- Gross vision intact, PERRLA, conjunctivae clear secretions            Ears- Hearing, canals-normal            Nose- Clear, no-Septal dev, mucus, polyps, erosion, perforation             Throat- Mallampati II , mucosa clear , drainage- none, tonsils- atrophic Neck- flexible , trachea midline, no stridor , thyroid nl, carotid no bruit Chest - symmetrical excursion , unlabored           Heart/CV- RRR , no murmur ,  no gallop  , no rub, nl s1 s2                           - JVD- none , edema- none, stasis changes- none, varices- none           Lung- clear to P&A, wheeze- none, cough- none , dullness-none, rub- none           Chest wall-  Abd-  Br/ Gen/ Rectal- Not done, not indicated Extrem- cyanosis- none, clubbing, none, atrophy- none, strength- nl Neuro- grossly intact to observation

## 2014-02-05 ENCOUNTER — Ambulatory Visit: Payer: Medicare Other | Admitting: Internal Medicine

## 2014-02-18 ENCOUNTER — Other Ambulatory Visit: Payer: Self-pay

## 2014-02-18 ENCOUNTER — Ambulatory Visit (INDEPENDENT_AMBULATORY_CARE_PROVIDER_SITE_OTHER): Payer: Medicare Other | Admitting: Interventional Cardiology

## 2014-02-18 ENCOUNTER — Encounter: Payer: Self-pay | Admitting: Interventional Cardiology

## 2014-02-18 VITALS — BP 126/68 | HR 78 | Ht 68.0 in | Wt 169.0 lb

## 2014-02-18 DIAGNOSIS — J449 Chronic obstructive pulmonary disease, unspecified: Secondary | ICD-10-CM

## 2014-02-18 DIAGNOSIS — I2510 Atherosclerotic heart disease of native coronary artery without angina pectoris: Secondary | ICD-10-CM

## 2014-02-18 DIAGNOSIS — Z77090 Contact with and (suspected) exposure to asbestos: Secondary | ICD-10-CM

## 2014-02-18 MED ORDER — ISOSORBIDE MONONITRATE ER 60 MG PO TB24: ORAL_TABLET | ORAL | Status: DC

## 2014-02-18 MED ORDER — NITROGLYCERIN 0.4 MG SL SUBL: 0.4000 mg | SUBLINGUAL_TABLET | SUBLINGUAL | Status: DC | PRN

## 2014-02-18 NOTE — Progress Notes (Signed)
Patient ID: Stephen [name], male   DOB: 1932-09-22, 80 y.o.   MRN: [id number]    [address], [place]  [address] Phone: [phone] Fax:  [phone]  Date:  02/18/2014   ID:  Stephen [name], DOB 05-Jan-1933, MRN [id number]  PCP:  Simona Huh, MD   ASSESSMENT:  1. CAD with stable angina without change since last office visit 2. Hypertension, essential and controlled 3. Hyperlipidemia followed by primary care 4. Chronic kidney disease, stage III  PLAN:  1. Continue active lifestyle and call if change in anginal pattern 2. Nitroglycerin sublingually as needed if prolonged episodes of pain or episodes occurring at rest 3. Clinic follow-up in one year   SUBJECTIVE: Stephen [name] is a 80 y.o. male who states that he is doing well. He has had no recurrence of angina at rest. He does have angina with moderate to heavy exertion. This pattern is unchanged over 6 months. He has not needed nitroglycerin. His lifestyle is very acceptable. He is able work initial heart and do all things that require activity. He has not had syncope. He denies neurological complaints. No medication side effects.   Wt Readings from Last 3 Encounters:  02/18/14 169 lb (76.658 kg)  01/26/14 169 lb (76.658 kg)  12/03/13 165 lb (74.844 kg)     Past Medical History  Diagnosis Date  . COPD (chronic obstructive pulmonary disease)   . Asbestos exposure   . Abnormal chest x-ray   . CAD (coronary artery disease)   . DM (diabetes mellitus)   . GERD (gastroesophageal reflux disease)   . Hypertension   . Hyperlipidemia   . CKD (chronic kidney disease), stage III     Current Outpatient Prescriptions  Medication Sig Dispense Refill  . albuterol (PROVENTIL HFA;VENTOLIN HFA) 108 (90 BASE) MCG/ACT inhaler Inhale 2 puffs into the lungs every 6 (six) hours as needed.      Marland Kitchen albuterol (PROVENTIL) (2.5 MG/3ML) 0.083% nebulizer solution Take 3 mLs (2.5 mg total) by nebulization every  6 (six) hours as needed. DX:  496  FILE MCR PART B 360 mL 11  . aspirin EC 81 MG tablet Take 81 mg by mouth daily as needed for moderate pain.     Marland Kitchen glipiZIDE (GLUCOTROL) 5 MG tablet Take 5 mg by mouth 2 (two) times daily before a meal.      . hydrochlorothiazide (HYDRODIURIL) 25 MG tablet Take 25 mg by mouth at bedtime.     . isosorbide mononitrate (IMDUR) 60 MG 24 hr tablet TAKE ONE TABLET BY MOUTH EVERY DAY. 30 tablet 0  . latanoprost (XALATAN) 0.005 % ophthalmic solution Place 1 drop into both eyes At bedtime.    Marland Kitchen lisinopril (PRINIVIL,ZESTRIL) 5 MG tablet Take 5 mg by mouth at bedtime.     . metFORMIN (GLUCOPHAGE) 1000 MG tablet Take 1,000 mg by mouth 2 (two) times daily with a meal.      . nitroGLYCERIN (NITROSTAT) 0.4 MG SL tablet Place 0.4 mg under the tongue every 5 (five) minutes as needed. For chest pain. Do not exceed 3 pills in 15 minutes    . rosuvastatin (CRESTOR) 5 MG tablet Take 5 mg by mouth 3 (three) times a week.     . SYMBICORT 160-4.5 MCG/ACT inhaler Inhale 2 puffs into the lungs 2 (two) times daily. RINSE MOUTH AFTER USE     No current facility-administered medications for this visit.    Allergies:    Allergies  Allergen Reactions  . Codeine     REACTION: nausea  . Morphine Other (See Comments)    Caused hypersensitivity. Patient states it "really made me high"    Social History:  The patient  reports that he quit smoking about 12 years ago. He has never used smokeless tobacco. He reports that he drinks alcohol. He reports that he does not use illicit drugs.   ROS:  Please see the history of present illness.   Appetite, weight, and energy have been stable. No blood in the urine or stool. Denies transient neurological complaints. No palpitations. Denies orthopnea, PND, and edema.   All other systems reviewed and negative.   OBJECTIVE: VS:  BP 126/68 mmHg  Pulse 78  Ht 5\' 8"  (1.727 m)  Wt 169 lb (76.658 kg)  BMI 25.70 kg/m2  SpO2 98% Well nourished, well  developed, in no acute distress, obese but appearing younger than stated age 37: normal Neck: JVD flat. Carotid bruit absent  Cardiac:  normal S1, S2; RRR; no murmur Lungs:  clear to auscultation bilaterally, no wheezing, rhonchi or rales Abd: soft, nontender, no hepatomegaly Ext: Edema absent. Pulses 2+ Skin: warm and dry Neuro:  CNs 2-12 intact, no focal abnormalities noted  EKG:  Not performed       Signed, Illene Labrador III, MD 02/18/2014 3:18 PM

## 2014-02-18 NOTE — Patient Instructions (Signed)
Your physician recommends that you continue on your current medications as directed. Please refer to the Current Medication list given to you today.  Your physician discussed the importance of regular exercise and recommended that you start or continue a regular exercise program for good health.  Your physician wants you to follow-up in: 1 year with Dr.Smith You will receive a reminder letter in the mail two months in advance. If you don't receive a letter, please call our office to schedule the follow-up appointment.  

## 2014-04-15 ENCOUNTER — Other Ambulatory Visit: Payer: Self-pay | Admitting: Internal Medicine

## 2014-09-09 ENCOUNTER — Other Ambulatory Visit: Payer: Self-pay | Admitting: Internal Medicine

## 2014-11-18 ENCOUNTER — Other Ambulatory Visit: Payer: Self-pay | Admitting: Internal Medicine

## 2014-12-31 ENCOUNTER — Ambulatory Visit (INDEPENDENT_AMBULATORY_CARE_PROVIDER_SITE_OTHER): Payer: Medicare Other

## 2014-12-31 DIAGNOSIS — Z23 Encounter for immunization: Secondary | ICD-10-CM | POA: Diagnosis not present

## 2015-01-12 ENCOUNTER — Other Ambulatory Visit: Payer: Self-pay | Admitting: Internal Medicine

## 2015-01-25 ENCOUNTER — Ambulatory Visit (INDEPENDENT_AMBULATORY_CARE_PROVIDER_SITE_OTHER): Payer: Medicare Other | Admitting: Internal Medicine

## 2015-01-25 ENCOUNTER — Encounter: Payer: Self-pay | Admitting: Internal Medicine

## 2015-01-25 ENCOUNTER — Ambulatory Visit (INDEPENDENT_AMBULATORY_CARE_PROVIDER_SITE_OTHER)
Admission: RE | Admit: 2015-01-25 | Discharge: 2015-01-25 | Disposition: A | Discharge status: Home / Self Care | Payer: Medicare Other | Source: Ambulatory Visit | Attending: Internal Medicine | Admitting: Internal Medicine

## 2015-01-25 VITALS — BP 122/72 | HR 85 | Ht 68.0 in | Wt 167.4 lb

## 2015-01-25 DIAGNOSIS — Z77090 Contact with and (suspected) exposure to asbestos: Secondary | ICD-10-CM

## 2015-01-25 DIAGNOSIS — J441 Chronic obstructive pulmonary disease with (acute) exacerbation: Secondary | ICD-10-CM

## 2015-01-25 DIAGNOSIS — J449 Chronic obstructive pulmonary disease, unspecified: Secondary | ICD-10-CM | POA: Diagnosis not present

## 2015-01-25 DIAGNOSIS — Z23 Encounter for immunization: Secondary | ICD-10-CM | POA: Diagnosis not present

## 2015-01-25 DIAGNOSIS — I2510 Atherosclerotic heart disease of native coronary artery without angina pectoris: Secondary | ICD-10-CM

## 2015-01-25 MED ORDER — METHYLPREDNISOLONE ACETATE 80 MG/ML IJ SUSP
80.0000 mg | Freq: Once | INTRAMUSCULAR | Status: AC
  Administered 2015-01-25: 80 mg via INTRAMUSCULAR

## 2015-01-25 NOTE — Patient Instructions (Signed)
Depo 33  Prevnar 13  Order- CXR   dx acute exacerbation COPD, asbestos exposure

## 2015-01-25 NOTE — Progress Notes (Signed)
02/07/11- 60 yoM former smoker followed for COPD with history of asbestos exposure, complicated by CAD, DM, GERD, wife here. LOV-02/09/2010 He says he is "fine". Uses Advair once daily and his nebulizer machine up to 3 times daily. Carries his rescue inhaler but rarely uses it. Cough is usually productive but only white. He denies chest pain. Uses a riding mower to [employer] his own lawn. Chest x-ray: 02/09/2010-COPD and old pleural plaques reflecting asbestos exposure. No new process. I discussed with him the difference between pleural plaques and mesothelioma.  02/07/12- 89 yoM former smoker followed for COPD with history of asbestos exposure, complicated by CAD, DM, GERD, wife here. Had flu vaccine. Reports doing well with rare need for rescue inhaler as long as he uses his nebulizer twice daily. Paces himself. No acute problems. COPD assessment test (CAT) score 15/40 CXR 02/14/11-reviewed with him IMPRESSION:  Emphysematous and chronic bronchitic changes with again identified  calcified pleural plaque disease compatible with asbestos exposure.  No definite acute process identified.  Original Report Authenticated By: Burnetta Sabin, M.D.   02/06/13- 49 yoM former smoker followed for COPD with history of asbestos exposure, complicated by CAD, DM, GERD,  FOLLOWS FOR: Pt states no troubles with breathing Denies cough, wheeze, unusual dyspnea with exertion. Had flu vaccine. Using rescue inhaler 3 or 4 times per week. Denies any heart problems since last here.  01/26/14- 62 yoM former smoker followed for COPD with history of asbestos exposure, complicated by CAD, DM, GERD Follow for: patient complain of cough, congestion, wheezing and chest tightness for one week and a half. Patient denies any fever.  Patient is using inhalers as directed.  01/25/15-81 yoM former smoker followed for COPD with history of asbestos exposure, complicated by CAD, DM, GERD Follows for: Pt c/o congestion, cough with white  mucus, wheeze and some chest tightness. Pt states that he is using the albuterol HFA 3 times a day. Wife here Mild cold increased wheeze and cough for a while. Continues Symbicort. Denies fever, blood, chest pain, adenopathy. Chest x-ray reviewed with him CXR 01/26/14 IMPRESSION: Stable bilateral pleural plaques. No significant change compared to prior exam. Electronically Signed  By: Sabino Dick M.D.  On: 01/26/2014 10:08  ROS, see HPI Constitutional:   No-   weight loss, night sweats, fevers, chills, fatigue, lassitude. HEENT:   No-  headaches, difficulty swallowing, tooth/dental problems, sore throat,       No-  sneezing, itching, ear ache, nasal congestion, post nasal drip,  CV:  No-   chest pain, orthopnea, PND, swelling in lower extremities, anasarca, dizziness, palpitations Resp: No- acute  shortness of breath with exertion or at rest.              No-   productive cough,  No non-productive cough,  No- coughing up of blood.              No-   change in color of mucus.  No- wheezing.   Skin: No-   rash or lesions. GI:  No-   heartburn, indigestion, abdominal pain, nausea, vomiting,  GU:  MS:  No-   joint pain or swelling.   Neuro-     nothing unusual Psych:  No- change in mood or affect. No depression or anxiety.  No memory loss.  OBJ General- Alert, Oriented, Affect-appropriate, Distress- none acute Skin- rash-none, lesions- none, excoriation- none Lymphadenopathy- none Head- atraumatic            Eyes- Gross vision intact, PERRLA, conjunctivae  clear secretions            Ears- Hearing, canals-normal            Nose- Clear, no-Septal dev, mucus, polyps, erosion, perforation             Throat- Mallampati II , mucosa clear , drainage- none, tonsils- atrophic Neck- flexible , trachea midline, no stridor , thyroid nl, carotid no bruit Chest - symmetrical excursion , unlabored           Heart/CV- RRR , no murmur , no gallop  , no rub, nl s1 s2                           -  JVD- none , edema- none, stasis changes- none, varices- none           Lung- + diminished unlabored, wheeze- none, cough- none , dullness-none, rub- none           Chest wall-  Abd-  Br/ Gen/ Rectal- Not done, not indicated Extrem- cyanosis- none, clubbing, none, atrophy- none, strength- nl Neuro- grossly intact to observation

## 2015-02-21 ENCOUNTER — Other Ambulatory Visit: Payer: Self-pay | Admitting: Interventional Cardiology

## 2015-02-22 NOTE — Assessment & Plan Note (Signed)
He resolved a recent chest cold and is near baseline now. Plan-Depo-Medrol, chest x-ray

## 2015-02-22 NOTE — Assessment & Plan Note (Signed)
Known pleural plaques. Ongoing surveillance

## 2015-02-22 NOTE — Assessment & Plan Note (Signed)
Known CAD without exertional chest pain or palpitation described at this visit

## 2015-04-12 ENCOUNTER — Ambulatory Visit: Payer: Medicare Other | Admitting: Interventional Cardiology

## 2015-04-13 ENCOUNTER — Other Ambulatory Visit: Payer: Self-pay | Admitting: Interventional Cardiology

## 2015-05-11 ENCOUNTER — Other Ambulatory Visit: Payer: Self-pay | Admitting: Internal Medicine

## 2015-05-11 ENCOUNTER — Other Ambulatory Visit: Payer: Self-pay | Admitting: Interventional Cardiology

## 2015-07-13 ENCOUNTER — Other Ambulatory Visit: Payer: Self-pay | Admitting: Interventional Cardiology

## 2015-07-13 ENCOUNTER — Other Ambulatory Visit: Payer: Self-pay | Admitting: Internal Medicine

## 2015-07-14 ENCOUNTER — Encounter: Payer: Self-pay | Admitting: Interventional Cardiology

## 2015-07-14 ENCOUNTER — Other Ambulatory Visit: Payer: Self-pay | Admitting: *Deleted

## 2015-07-14 ENCOUNTER — Ambulatory Visit (INDEPENDENT_AMBULATORY_CARE_PROVIDER_SITE_OTHER): Payer: Medicare Other | Admitting: Interventional Cardiology

## 2015-07-14 VITALS — BP 116/64 | HR 86 | Ht 68.0 in | Wt 162.2 lb

## 2015-07-14 DIAGNOSIS — J449 Chronic obstructive pulmonary disease, unspecified: Secondary | ICD-10-CM | POA: Diagnosis not present

## 2015-07-14 DIAGNOSIS — I2510 Atherosclerotic heart disease of native coronary artery without angina pectoris: Secondary | ICD-10-CM | POA: Diagnosis not present

## 2015-07-14 MED ORDER — ISOSORBIDE MONONITRATE ER 60 MG PO TB24: 60.0000 mg | ORAL_TABLET | Freq: Every day | ORAL | Status: DC

## 2015-07-14 NOTE — Patient Instructions (Signed)

## 2015-07-14 NOTE — Progress Notes (Signed)
Cardiology Office Note   Date:  07/14/2015   ID:  IROH Stephen [name], DOB 26-Mar-1933, MRN [id number]  PCP:  Simona Huh, MD  Cardiologist:  Sinclair Grooms, MD   Chief Complaint  Patient presents with  . Coronary Artery Disease      History of Present Illness: Stephen [name] is a 82 y.o. male who presents for Nonobstructive coronary disease, recurring atypical chest discomfort.  Patient is doing well. No change in pain pattern. Chest pain is described as fleeting pinpoint pressure of low intensity lasting 2-3 seconds and resolving. Only occurs at rest. Never uses nitroglycerin. Cath demonstrated nonobstructive LAD disease. No nitroglycerin use.    Past Medical History  Diagnosis Date  . COPD (chronic obstructive pulmonary disease) ([place])   . Asbestos exposure   . Abnormal chest x-ray   . CAD (coronary artery disease)   . DM (diabetes mellitus) ([place])   . GERD (gastroesophageal reflux disease)   . Hypertension   . Hyperlipidemia   . CKD (chronic kidney disease), stage III     Past Surgical History  Procedure Laterality Date  . Inguinal hernia repair    . Cardiac catheterization    . Cholecystectomy    . Cataracts    . Cardiac catheterization    . Cataract extraction w/phaco Left 09/21/2013    Procedure: CATARACT EXTRACTION PHACO AND INTRAOCULAR LENS PLACEMENT (IOC);  Surgeon: Tonny Branch, MD;  Location: [facility];  Service: Ophthalmology;  Laterality: Left;  CDE: 7.34     Current Outpatient Prescriptions  Medication Sig Dispense Refill  . albuterol (PROVENTIL HFA;VENTOLIN HFA) 108 (90 BASE) MCG/ACT inhaler Inhale 2 puffs into the lungs every 6 (six) hours as needed.      Marland Kitchen albuterol (PROVENTIL) (2.5 MG/3ML) 0.083% nebulizer solution USE 1 VIAL IN NEBULIZER EVERY 6 HOURS AS NEEDED. 375 mL 0  . aspirin EC 81 MG tablet Take 81 mg by mouth daily as needed for moderate pain.     Marland Kitchen glipiZIDE (GLUCOTROL) 5 MG tablet Take 5 mg by mouth 2 (two) times daily before a  meal.      . hydrochlorothiazide (HYDRODIURIL) 25 MG tablet Take 25 mg by mouth at bedtime.     . isosorbide mononitrate (IMDUR) 60 MG 24 hr tablet Take 1 tablet (60 mg total) by mouth daily. 30 tablet 11  . latanoprost (XALATAN) 0.005 % ophthalmic solution Place 1 drop into both eyes At bedtime.    Marland Kitchen lisinopril (PRINIVIL,ZESTRIL) 5 MG tablet Take 5 mg by mouth at bedtime.     . metFORMIN (GLUCOPHAGE) 1000 MG tablet Take 1,000 mg by mouth 2 (two) times daily with a meal.      . nitroGLYCERIN (NITROSTAT) 0.4 MG SL tablet Place 1 tablet (0.4 mg total) under the tongue every 5 (five) minutes as needed. For chest pain. Do not exceed 3 pills in 15 minutes 25 tablet 3  . ONE TOUCH ULTRA TEST test strip Use as directed daily.    Glory Rosebush DELICA LANCETS 99991111 MISC Use as directed daily.    . rosuvastatin (CRESTOR) 5 MG tablet Take 5 mg by mouth 3 (three) times a week.     . SYMBICORT 160-4.5 MCG/ACT inhaler Inhale 2 puffs into the lungs 2 (two) times daily. RINSE MOUTH AFTER USE     No current facility-administered medications for this visit.    Allergies:   Codeine and Morphine    Social History:  The patient  reports that he quit smoking about  14 years ago. He has never used smokeless tobacco. He reports that he drinks alcohol. He reports that he does not use illicit drugs.   Family History:  The patient's family history includes Diabetes in his brother; Heart disease in his father; Lung cancer in his father.    ROS:  Please see the history of present illness.   Otherwise, review of systems are positive for Occasional wheezing, back pain, muscle spasms, easy bruising, and change in appetite..   All other systems are reviewed and negative.    PHYSICAL EXAM: VS:  BP 116/64 mmHg  Pulse 86  Ht 5\' 8"  (1.727 m)  Wt 162 lb 3.2 oz (73.573 kg)  BMI 24.67 kg/m2 , BMI Body mass index is 24.67 kg/(m^2). GEN: Well nourished, well developed, in no acute distress HEENT: normal Neck: no JVD, carotid  bruits, or masses Cardiac: RRR.  There is no murmur, rub, or gallop. There is no edema. Respiratory:  clear to auscultation bilaterally, normal work of breathing. GI: soft, nontender, nondistended, + BS MS: no deformity or atrophy Skin: warm and dry, no rash Neuro:  Strength and sensation are intact Psych: euthymic mood, full affect   EKG:  EKG is ordered today. The ekg reveals normal appearance with normal tracing   Recent Labs: No results found for requested labs within last 365 days.    Lipid Panel    Component Value Date/Time   CHOL  08/17/2009 0311    108        ATP III CLASSIFICATION:  <200     mg/dL   Desirable  200-239  mg/dL   Borderline High  >=240    mg/dL   High          TRIG 104 08/17/2009 0311   HDL 32* 08/17/2009 0311   CHOLHDL 3.4 08/17/2009 0311   VLDL 21 08/17/2009 0311   LDLCALC  08/17/2009 0311    55        Total Cholesterol/HDL:CHD Risk Coronary Heart Disease Risk Table                     Men   Women  1/2 Average Risk   3.4   3.3  Average Risk       5.0   4.4  2 X Average Risk   9.6   7.1  3 X Average Risk  23.4   11.0        Use the calculated Patient Ratio above and the CHD Risk Table to determine the patient's CHD Risk.        ATP III CLASSIFICATION (LDL):  <100     mg/dL   Optimal  100-129  mg/dL   Near or Above                    Optimal  130-159  mg/dL   Borderline  160-189  mg/dL   High  >190     mg/dL   Very High      Wt Readings from Last 3 Encounters:  07/14/15 162 lb 3.2 oz (73.573 kg)  01/25/15 167 lb 6.4 oz (75.932 kg)  02/18/14 169 lb (76.658 kg)      Other studies Reviewed: Additional studies/ records that were reviewed today include: None. The findings include none.    ASSESSMENT AND PLAN:  1. Atherosclerosis of native coronary artery of native heart without angina pectoris Asymptomatic - EKG 12-Lead  2. Chronic obstructive pulmonary disease, unspecified COPD, unspecified chronic bronchitis  type Stable  3. Noncardiac chest pain Fleeting episodes of pinpoint pressure the last 2-3 seconds at random   Current medicines are reviewed at length with the patient today.  The patient has the following concerns regarding medicines: None.  The following changes/actions have been instituted:    Aerobic activity  Reassurance  Labs/ tests ordered today include:  Orders Placed This Encounter  Procedures  . EKG 12-Lead     Disposition:   FU with HS in 1 year  Signed, Sinclair Grooms, MD  07/14/2015 11:35 AM    [facility] [address], [place], [place]  [address] Phone: [phone]; Fax: [phone]

## 2015-09-21 ENCOUNTER — Other Ambulatory Visit: Payer: Self-pay | Admitting: Internal Medicine

## 2015-10-19 ENCOUNTER — Other Ambulatory Visit: Payer: Self-pay | Admitting: Internal Medicine

## 2016-01-11 ENCOUNTER — Other Ambulatory Visit: Payer: Self-pay | Admitting: Internal Medicine

## 2016-01-25 ENCOUNTER — Ambulatory Visit: Payer: Medicare Other | Admitting: Internal Medicine

## 2016-02-01 ENCOUNTER — Ambulatory Visit (INDEPENDENT_AMBULATORY_CARE_PROVIDER_SITE_OTHER): Payer: Medicare Other | Admitting: Internal Medicine

## 2016-02-01 ENCOUNTER — Encounter: Payer: Self-pay | Admitting: Internal Medicine

## 2016-02-01 ENCOUNTER — Ambulatory Visit (INDEPENDENT_AMBULATORY_CARE_PROVIDER_SITE_OTHER)
Admission: RE | Admit: 2016-02-01 | Discharge: 2016-02-01 | Disposition: A | Discharge status: Home / Self Care | Payer: Medicare Other | Source: Ambulatory Visit | Attending: Internal Medicine | Admitting: Internal Medicine

## 2016-02-01 VITALS — BP 110/60 | HR 80 | Ht 68.0 in | Wt 167.4 lb

## 2016-02-01 DIAGNOSIS — J300 Vasomotor rhinitis: Secondary | ICD-10-CM | POA: Insufficient documentation

## 2016-02-01 DIAGNOSIS — J449 Chronic obstructive pulmonary disease, unspecified: Secondary | ICD-10-CM

## 2016-02-01 DIAGNOSIS — Z77090 Contact with and (suspected) exposure to asbestos: Secondary | ICD-10-CM

## 2016-02-01 MED ORDER — AZELASTINE HCL 0.15 % NA SOLN: NASAL | 12 refills | Status: DC

## 2016-02-01 NOTE — Patient Instructions (Addendum)
We can refill meds as needed  Script to try azelastine nasal spray once or twice daily as needed for watery nose.  Please call as needed  Order- CXR   Dx COPD, asbestos exposure

## 2016-02-01 NOTE — Assessment & Plan Note (Signed)
No concerning chest pain, adenopathy or blood. Plan-chest x-ray annual surveillance

## 2016-02-01 NOTE — Progress Notes (Signed)
M former smoker followed for COPD with history of asbestos exposure, complicated by CAD, DM, GERD  01/26/14- 80 yoM former smoker followed for COPD with history of asbestos exposure, complicated by CAD, DM, GERD Follow for: patient complain of cough, congestion, wheezing and chest tightness for one week and a half. Patient denies any fever.  Patient is using inhalers as directed.  01/25/15-81 yoM former smoker followed for COPD with history of asbestos exposure, complicated by CAD, DM, GERD Follows for: Pt c/o congestion, cough with white mucus, wheeze and some chest tightness. Pt states that he is using the albuterol HFA 3 times a day. Wife here Mild cold increased wheeze and cough for a while. Continues Symbicort. Denies fever, blood, chest pain, adenopathy. Chest x-ray reviewed with him CXR 01/26/14 IMPRESSION: Stable bilateral pleural plaques. No significant change compared to prior exam. Electronically Signed  By: Sabino Dick M.D.  On: 01/26/2014 10:08  02/01/2016-82 year old male former smoker followed for COPD, history asbestos exposure, complicated by CAD, DM 2, GERD, glaucoma FOLLOWS FOR. pt states he is having running nose and mucus but it is clear. he tried everything OTC meds, but no relief.  He has felt stable with season change. Little or no routine cough and no chest pain or wheeze. He is comfortable with his meds used as directed and denies need for refills today.  ROS, see HPI Constitutional:   No-   weight loss, night sweats, fevers, chills, fatigue, lassitude. HEENT:   No-  headaches, difficulty swallowing, tooth/dental problems, sore throat,       No-  sneezing, itching, ear ache, nasal congestion, post nasal drip,  CV:  No-   chest pain, orthopnea, PND, swelling in lower extremities, anasarca, dizziness, palpitations Resp: No- acute  shortness of breath with exertion or at rest.              No-   productive cough,  No non-productive cough,  No- coughing up of  blood.              No-   change in color of mucus.  No- wheezing.   Skin: No-   rash or lesions. GI:  No-   heartburn, indigestion, abdominal pain, nausea, vomiting,  GU:  MS:  No-   joint pain or swelling.   Neuro-     nothing unusual Psych:  No- change in mood or affect. No depression or anxiety.  No memory loss.  OBJ General- Alert, Oriented, Affect-appropriate, Distress- none acute Skin- rash-none, lesions- none, excoriation- none Lymphadenopathy- none Head- atraumatic            Eyes- Gross vision intact, PERRLA, conjunctivae clear secretions            Ears- Hearing, canals-normal            Nose- Clear, no-Septal dev, mucus, polyps, erosion, perforation             Throat- Mallampati II , mucosa clear , drainage- none, tonsils- atrophic Neck- flexible , trachea midline, no stridor , thyroid nl, carotid no bruit Chest - symmetrical excursion , unlabored           Heart/CV- RRR , no murmur , no gallop  , no rub, nl s1 s2                           - JVD- none , edema- none, stasis changes- none, varices- none  Lung- + diminished unlabored, wheeze- none, cough- none , dullness-none, rub- none           Chest wall-  Abd-  Br/ Gen/ Rectal- Not done, not indicated Extrem- cyanosis- none, clubbing, none, atrophy- none, strength- nl Neuro- grossly intact to observation

## 2016-02-01 NOTE — Assessment & Plan Note (Signed)
Somewhat annoying increase in nasal drainage with postnasal drip since weather change. We discussed allergic versus nonallergic. Plan-azelastine nasal spray for trial

## 2016-02-01 NOTE — Assessment & Plan Note (Signed)
Stable control at this time without exacerbation. Medications appropriate

## 2016-02-15 ENCOUNTER — Telehealth: Payer: Self-pay | Admitting: Internal Medicine

## 2016-02-15 NOTE — Telephone Encounter (Signed)
Notes Recorded by Deneise Lever, MD on 02/01/2016 at 2:24 PM EDT CXR- COPD and asbestos plaques are seen as before. No new or active lung process seen  Pt aware of results.

## 2016-03-19 ENCOUNTER — Other Ambulatory Visit: Payer: Self-pay | Admitting: Internal Medicine

## 2016-05-07 ENCOUNTER — Other Ambulatory Visit (HOSPITAL_COMMUNITY): Payer: Self-pay | Admitting: Family Medicine

## 2016-05-07 DIAGNOSIS — R110 Nausea: Secondary | ICD-10-CM

## 2016-05-07 DIAGNOSIS — R1010 Upper abdominal pain, unspecified: Secondary | ICD-10-CM

## 2016-05-10 ENCOUNTER — Ambulatory Visit (HOSPITAL_COMMUNITY)
Admission: RE | Admit: 2016-05-10 | Discharge: 2016-05-10 | Disposition: A | Discharge status: Home / Self Care | Payer: Medicare Other | Source: Ambulatory Visit | Attending: Family Medicine | Admitting: Family Medicine

## 2016-05-10 DIAGNOSIS — R110 Nausea: Secondary | ICD-10-CM | POA: Insufficient documentation

## 2016-05-10 DIAGNOSIS — R1010 Upper abdominal pain, unspecified: Secondary | ICD-10-CM | POA: Diagnosis not present

## 2016-05-16 ENCOUNTER — Other Ambulatory Visit: Payer: Self-pay | Admitting: Internal Medicine

## 2016-06-09 ENCOUNTER — Other Ambulatory Visit: Payer: Self-pay | Admitting: Interventional Cardiology

## 2016-06-21 ENCOUNTER — Encounter: Payer: Medicare Other | Attending: Family Medicine | Admitting: Nutrition

## 2016-06-21 DIAGNOSIS — I129 Hypertensive chronic kidney disease with stage 1 through stage 4 chronic kidney disease, or unspecified chronic kidney disease: Secondary | ICD-10-CM | POA: Insufficient documentation

## 2016-06-21 DIAGNOSIS — E1122 Type 2 diabetes mellitus with diabetic chronic kidney disease: Secondary | ICD-10-CM | POA: Diagnosis not present

## 2016-06-21 DIAGNOSIS — E1129 Type 2 diabetes mellitus with other diabetic kidney complication: Secondary | ICD-10-CM | POA: Insufficient documentation

## 2016-06-21 DIAGNOSIS — Z713 Dietary counseling and surveillance: Secondary | ICD-10-CM | POA: Insufficient documentation

## 2016-06-21 DIAGNOSIS — N189 Chronic kidney disease, unspecified: Secondary | ICD-10-CM | POA: Diagnosis not present

## 2016-06-21 NOTE — Progress Notes (Signed)
Diabetes Self-Management Education  Visit Type: (P) First/Initial  Appt. Start Time: 1000 Appt. End Time: 1100  06/26/2016  [name], identified by name and date of birth, is a 83 y.o. male with a diagnosis of Diabetes: (P) Type 2. Currently on Glipizide 5 mg and Onglyza 2.5 mg per day . Here with his wife today. Had to stop Metformin due to kidney function.  ASSESSMENT  Height 5\' 8"  (1.727 m), weight 164 lb (74.4 kg). Body mass index is 24.94 kg/m.      Diabetes Self-Management Education - 06/21/16 1002      Visit Information   Visit Type (P)  First/Initial     Initial Visit   Diabetes Type (P)  Type 2   Are you currently following a meal plan? (P)  No   Are you taking your medications as prescribed? (P)  Yes   Date Diagnosed (P)  [place]   How would you rate your overall health? (P)  Good     Psychosocial Assessment   Patient Belief/Attitude about Diabetes (P)  Motivated to manage diabetes   Self-care barriers (P)  None   Self-management support (P)  Doctor's office;Family   Other persons present (P)  Patient;Family Member   Patient Concerns (P)  Nutrition/Meal planning;Medication;Monitoring;Glycemic Control;Weight Control   Special Needs (P)  None   Preferred Learning Style (P)  No preference indicated   Learning Readiness (P)  Not Ready   How often do you need to have someone help you when you read instructions, pamphlets, or other written materials from your doctor or pharmacy? (P)  1 - Never   What is the last grade level you completed in school? (P)  12     Pre-Education Assessment   Patient understands the diabetes disease and treatment process. (P)  Needs Review   Patient understands incorporating nutritional management into lifestyle. (P)  Needs Review   Patient undertands incorporating physical activity into lifestyle. (P)  Needs Review   Patient understands using medications safely. (P)  Needs Review   Patient understands  monitoring blood glucose, interpreting and using results (P)  Needs Review   Patient understands prevention, detection, and treatment of acute complications. (P)  Needs Review   Patient understands prevention, detection, and treatment of chronic complications. (P)  Needs Review   Patient understands how to develop strategies to address psychosocial issues. (P)  Needs Review   Patient understands how to develop strategies to promote health/change behavior. (P)  Needs Review     Complications   Last HgB A1C per patient/outside source (P)  7.9 %   How often do you check your blood sugar? (P)  1-2 times/day   Fasting Blood glucose range (mg/dL) (P)  180-200   Postprandial Blood glucose range (mg/dL) (P)  180-200   Number of hypoglycemic episodes per month (P)  0   Have you had a dilated eye exam in the past 12 months? (P)  Yes   Have you had a dental exam in the past 12 months? (P)  No   Are you checking your feet? (P)  Yes   How many days per week are you checking your feet? (P)  7     Dietary Intake   Breakfast (P)  Special K with milk, 4 oz juice and coffee with cream or egg, sausage or bacon and english muffin.   Lunch (P)  Sandwich with ham [place] and baloney   Snack (afternoon) (P)  orange or glucerna   Dinner (P)  Steak, or chicken, ornage juice, bananas, potatoes, or limas, string bean      Individualized Plan for Diabetes Self-Management Training:   Learning Objective:  Patient will have a greater understanding of diabetes self-management. Patient education plan is to attend individual and/or group sessions per assessed needs and concerns.   Plan:  Goals 1. Follow My Plate 2. Eat three meals per day 3. Avoid snacks between meals and late at night. 4. Drink 4-5 bottles of water per day 5. Exercise 15-30 minutes as tolerated daily. Take meds as prescribed.  Expected Outcomes:     Education material provided: Living Well with Diabetes, Food label handouts, Meal plan  card, My Plate and Carbohydrate counting sheet  If problems or questions, patient to contact team via:  Phone and Email  Future DSME appointment:   3 months.

## 2016-06-26 NOTE — Patient Instructions (Signed)
Goals 1. Follow My Plate 2. Eat three meals per day 3. Avoid snacks between meals and late at night. 4. Drink 4-5 bottles of water per day 5. Exercise 15-30 minutes as tolerated daily. Take meds as prescribed.

## 2016-07-02 ENCOUNTER — Other Ambulatory Visit: Payer: Self-pay | Admitting: Internal Medicine

## 2016-07-06 ENCOUNTER — Encounter: Payer: Self-pay | Admitting: Interventional Cardiology

## 2016-07-24 ENCOUNTER — Ambulatory Visit: Payer: Medicare Other | Admitting: Nutrition

## 2016-07-24 NOTE — Progress Notes (Signed)
Cardiology Office Note    Date:  07/25/2016   ID:  Stephen [name], DOB [birth year], MRN [id number]  PCP:  Simona Huh, MD  Cardiologist: Sinclair Grooms, MD   Chief Complaint  Patient presents with  . Coronary Artery Disease  . Shortness of Breath    History of Present Illness:  Stephen [name] is a 83 y.o. male who presents for Nonobstructive coronary disease, recurring atypical chest discomfort.  He is doing well. He denies chest discomfort and palpitations. No episodes of syncope. Does have some exertional dyspnea however over the past year the dyspnea has slowly improved. He denies claudication. No transient neurological symptoms.  Past Medical History:  Diagnosis Date  . Abnormal chest x-ray   . Asbestos exposure   . CAD (coronary artery disease)   . CKD (chronic kidney disease), stage III   . COPD (chronic obstructive pulmonary disease) ([place])   . DM (diabetes mellitus) ([place])   . GERD (gastroesophageal reflux disease)   . Hyperlipidemia   . Hypertension     Past Surgical History:  Procedure Laterality Date  . CARDIAC CATHETERIZATION    . CARDIAC CATHETERIZATION    . CATARACT EXTRACTION W/PHACO Left 09/21/2013   Procedure: CATARACT EXTRACTION PHACO AND INTRAOCULAR LENS PLACEMENT (IOC);  Surgeon: Tonny Branch, MD;  Location: [facility];  Service: Ophthalmology;  Laterality: Left;  CDE: 7.34  . CATARACTS    . CHOLECYSTECTOMY    . INGUINAL HERNIA REPAIR      Current Medications: Outpatient Medications Prior to Visit  Medication Sig Dispense Refill  . albuterol (PROVENTIL) (2.5 MG/3ML) 0.083% nebulizer solution USE 1 VIAL IN NEBULIZER EVERY 6 HOURS AS NEEDED. 375 mL 0  . aspirin EC 81 MG tablet Take 81 mg by mouth daily as needed for moderate pain.     Marland Kitchen glipiZIDE (GLUCOTROL) 5 MG tablet Take 5 mg by mouth 2 (two) times daily before a meal.      . isosorbide mononitrate (IMDUR) 60 MG 24 hr tablet TAKE ONE TABLET BY MOUTH ONCE DAILY. 30 tablet 1  . latanoprost  (XALATAN) 0.005 % ophthalmic solution Place 1 drop into both eyes At bedtime.    . ONE TOUCH ULTRA TEST test strip Use as directed daily.    Glory Rosebush DELICA LANCETS 32R MISC Use as directed daily.    . saxagliptin HCl (ONGLYZA) 2.5 MG TABS tablet Take 2.5 mg by mouth daily.    . SYMBICORT 160-4.5 MCG/ACT inhaler Inhale 2 puffs into the lungs 2 (two) times daily. RINSE MOUTH AFTER USE    . Azelastine HCl 0.15 % SOLN 1-2 puffs each nostril once or twice daily if needed 30 mL 12  . hydrochlorothiazide (HYDRODIURIL) 25 MG tablet Take 25 mg by mouth at bedtime.     Marland Kitchen lisinopril (PRINIVIL,ZESTRIL) 5 MG tablet Take 5 mg by mouth at bedtime.     . metFORMIN (GLUCOPHAGE) 1000 MG tablet Take 1,000 mg by mouth 2 (two) times daily with a meal.      . nitroGLYCERIN (NITROSTAT) 0.4 MG SL tablet Place 1 tablet (0.4 mg total) under the tongue every 5 (five) minutes as needed. For chest pain. Do not exceed 3 pills in 15 minutes 25 tablet 3  . rosuvastatin (CRESTOR) 5 MG tablet Take 5 mg by mouth 3 (three) times a week.      No facility-administered medications prior to visit.      Allergies:   Codeine and Morphine   Social History  Social History  . Marital status: Married    Spouse name: N/A  . Number of children: 2  . Years of education: N/A   Social History Main Topics  . Smoking status: Former Smoker    Packs/day: 2.00    Years: 50.00    Quit date: 04/02/2001  . Smokeless tobacco: Never Used  . Alcohol use Yes     Comment: rarely  . Drug use: No  . Sexual activity: Not Asked   Other Topics Concern  . None   Social History Narrative   WIFE HAS LUNG CA     Family History:  The patient's family history includes Diabetes in his brother; Heart disease in his father; Lung cancer in his father.   ROS:   Please see the history of present illness.    Occasional wheezing. Occasional unexplained weight gain. Appetite is stable.  All other systems reviewed and are negative.   PHYSICAL  EXAM:   VS:  BP 122/72 (BP Location: Left Arm)   Pulse 77   Ht 5\' 8"  (1.727 m)   Wt 165 lb 3.2 oz (74.9 kg)   BMI 25.12 kg/m    GEN: Well nourished, well developed, in no acute distress . Elderly. No acute distress. HEENT: normal  Neck: no JVD, carotid bruits, or masses Cardiac: RRR; no murmurs, rubs, or gallops,no edema  Respiratory:  clear to auscultation bilaterally, normal work of breathing GI: soft, nontender, nondistended, + BS MS: no deformity or atrophy  Skin: warm and dry, no rash Neuro:  Alert and Oriented x 3, Strength and sensation are intact Psych: euthymic mood, full affect  Wt Readings from Last 3 Encounters:  07/25/16 165 lb 3.2 oz (74.9 kg)  06/21/16 164 lb (74.4 kg)  02/01/16 167 lb 6.4 oz (75.9 kg)      Studies/Labs Reviewed:   EKG:  EKG  Normal sinus rhythm without significant abnormality. Compared to the prior study, no changes noted.  Recent Labs: No results found for requested labs within last 8760 hours.   Lipid Panel    Component Value Date/Time   CHOL  08/17/2009 0311    108        ATP III CLASSIFICATION:  <200     mg/dL   Desirable  200-239  mg/dL   Borderline High  >=240    mg/dL   High          TRIG 104 08/17/2009 0311   HDL 32 (L) 08/17/2009 0311   CHOLHDL 3.4 08/17/2009 0311   VLDL 21 08/17/2009 0311   LDLCALC  08/17/2009 0311    55        Total Cholesterol/HDL:CHD Risk Coronary Heart Disease Risk Table                     Men   Women  1/2 Average Risk   3.4   3.3  Average Risk       5.0   4.4  2 X Average Risk   9.6   7.1  3 X Average Risk  23.4   11.0        Use the calculated Patient Ratio above and the CHD Risk Table to determine the patient's CHD Risk.        ATP III CLASSIFICATION (LDL):  <100     mg/dL   Optimal  100-129  mg/dL   Near or Above  Optimal  130-159  mg/dL   Borderline  160-189  mg/dL   High  >190     mg/dL   Very High    Additional studies/ records that were reviewed today  include:  Cardiac catheterization 2008:  CORONARY ANGIOGRAPHY:  1. Left main coronary:  Widely patent.  2. Left anterior descending coronary:  This is a large vessel that      wraps around the left ventricular apex.  On the first several      images the mid-LAD was narrowed diffusely.  After intracoronary      nitroglycerin the vessel was widely patent.  One dominant diagonal      branch arises from the proximal vessel.  Proximal to this there is      eccentric 30-40% narrowing in the LAD.  No significant obstruction      in the LAD is noted.  3. Circumflex artery:  The circumflex coronary artery is a large      vessel that trifurcates on the left lateral wall and contains no      obstruction.  4. Right coronary:  The right coronary is a dominant vessel giving to      left ventricular branches and a large posterior descending branch.      The vessel is normal without any significant obstruction.   CONCLUSION:  1. Normal left ventricular function.  2. Less than 50% obstruction in the proximal left anterior descending      artery, possibly causing endothelial dysfunction that could account      for the patient's discomfort.  No significant obstructive lesions      are noted.   ASSESSMENT:    1. Atherosclerosis of native coronary artery of native heart without angina pectoris   2. COPD mixed type ([place])   3. Asbestos exposure      PLAN:  In order of problems listed above:  1. Overall, doing well. No chest discomfort or anginal equivalent symptoms. 2. Followed by primary care and mildly symptomatic. 3. Not addressed.   No active cardiac issues. Clinical follow-up as needed or in one year. Call if chest pain.    Medication Adjustments/Labs and Tests Ordered: Current medicines are reviewed at length with the patient today.  Concerns regarding medicines are outlined above.  Medication changes, Labs and Tests ordered today are listed in the Patient Instructions below. There  are no Patient Instructions on file for this visit.   Signed, Sinclair Grooms, MD  07/25/2016 9:26 AM    [address], [place], [place]  [address] Phone: [phone]; Fax: [phone]

## 2016-07-25 ENCOUNTER — Encounter (INDEPENDENT_AMBULATORY_CARE_PROVIDER_SITE_OTHER): Payer: Self-pay

## 2016-07-25 ENCOUNTER — Encounter: Payer: Self-pay | Admitting: Interventional Cardiology

## 2016-07-25 ENCOUNTER — Ambulatory Visit (INDEPENDENT_AMBULATORY_CARE_PROVIDER_SITE_OTHER): Payer: Medicare Other | Admitting: Interventional Cardiology

## 2016-07-25 VITALS — BP 122/72 | HR 77 | Ht 68.0 in | Wt 165.2 lb

## 2016-07-25 DIAGNOSIS — Z77090 Contact with and (suspected) exposure to asbestos: Secondary | ICD-10-CM

## 2016-07-25 DIAGNOSIS — I2510 Atherosclerotic heart disease of native coronary artery without angina pectoris: Secondary | ICD-10-CM | POA: Diagnosis not present

## 2016-07-25 DIAGNOSIS — J449 Chronic obstructive pulmonary disease, unspecified: Secondary | ICD-10-CM | POA: Diagnosis not present

## 2016-07-25 NOTE — Patient Instructions (Signed)
Continue same medications.   Your physician wants you to follow-up in: 1 year.  You will receive a reminder letter in the mail two months in advance. If you don't receive a letter, please call our office to schedule the follow-up appointment.  

## 2016-07-31 ENCOUNTER — Other Ambulatory Visit: Payer: Self-pay | Admitting: Interventional Cardiology

## 2016-08-29 ENCOUNTER — Other Ambulatory Visit: Payer: Self-pay | Admitting: Internal Medicine

## 2016-09-05 ENCOUNTER — Emergency Department (HOSPITAL_COMMUNITY)
Admission: EM | Admit: 2016-09-05 | Discharge: 2016-09-05 | Disposition: A | Discharge status: Home / Self Care | Payer: Medicare Other | Attending: Emergency Medicine | Admitting: Emergency Medicine

## 2016-09-05 ENCOUNTER — Encounter (HOSPITAL_COMMUNITY): Payer: Self-pay | Admitting: Emergency Medicine

## 2016-09-05 DIAGNOSIS — I129 Hypertensive chronic kidney disease with stage 1 through stage 4 chronic kidney disease, or unspecified chronic kidney disease: Secondary | ICD-10-CM | POA: Diagnosis not present

## 2016-09-05 DIAGNOSIS — E1122 Type 2 diabetes mellitus with diabetic chronic kidney disease: Secondary | ICD-10-CM | POA: Diagnosis not present

## 2016-09-05 DIAGNOSIS — T63441A Toxic effect of venom of bees, accidental (unintentional), initial encounter: Secondary | ICD-10-CM | POA: Diagnosis present

## 2016-09-05 DIAGNOSIS — J449 Chronic obstructive pulmonary disease, unspecified: Secondary | ICD-10-CM | POA: Diagnosis not present

## 2016-09-05 DIAGNOSIS — Z87891 Personal history of nicotine dependence: Secondary | ICD-10-CM | POA: Diagnosis not present

## 2016-09-05 DIAGNOSIS — Z79899 Other long term (current) drug therapy: Secondary | ICD-10-CM | POA: Diagnosis not present

## 2016-09-05 DIAGNOSIS — I2510 Atherosclerotic heart disease of native coronary artery without angina pectoris: Secondary | ICD-10-CM | POA: Diagnosis not present

## 2016-09-05 DIAGNOSIS — N183 Chronic kidney disease, stage 3 (moderate): Secondary | ICD-10-CM | POA: Insufficient documentation

## 2016-09-05 MED ORDER — CEPHALEXIN 500 MG PO CAPS: 500.0000 mg | ORAL_CAPSULE | Freq: Four times a day (QID) | ORAL | 0 refills | Status: DC

## 2016-09-05 NOTE — Discharge Instructions (Signed)
Take one benadryl capsule every 4-6 hrs as needed for itching.  Apply 1% hydrocortisone cream to the affected area 3 times a day.  Return for any worsening symptoms such as swelling or increasing pain or redness.

## 2016-09-05 NOTE — ED Triage Notes (Signed)
Pt was stung by bee on right palm on Monday and has had redness and swelling since, has been soaking in epsom salt.  PT is diabetic.

## 2016-09-05 NOTE — ED Provider Notes (Signed)
[facility] DEPT Provider Note   CSN: [id number] Arrival date & time: 09/05/16  1326     History   Chief Complaint Chief Complaint  Patient presents with  . Insect Bite    HPI Stephen [name] is a 83 y.o. male.  HPI  Stephen [name] is a 83 y.o. male who presents to the Emergency Department complaining of bee sting x 2 to his right thumb that occurred two days ago.  He complains of itching and increasing redness to the affected area. His been soaking his hand in epsom salt without relief. Itching improves with benadryl. He denies swelling, red streaking, pain with hand or finger movement.   Past Medical History:  Diagnosis Date  . Abnormal chest x-ray   . Asbestos exposure   . CAD (coronary artery disease)   . CKD (chronic kidney disease), stage III   . COPD (chronic obstructive pulmonary disease) ([place])   . DM (diabetes mellitus) ([place])   . GERD (gastroesophageal reflux disease)   . Hyperlipidemia   . Hypertension     Patient Active Problem List   Diagnosis Date Noted  . Nonallergic vasomotor rhinitis 02/01/2016  . DYSPNEA 02/02/2008  . Coronary atherosclerosis 01/25/2008  . COPD mixed type ([place]) 01/25/2008  . DM 01/15/2007  . GERD 01/15/2007  . Asbestos exposure 01/15/2007    Past Surgical History:  Procedure Laterality Date  . CARDIAC CATHETERIZATION    . CARDIAC CATHETERIZATION    . CATARACT EXTRACTION W/PHACO Left 09/21/2013   Procedure: CATARACT EXTRACTION PHACO AND INTRAOCULAR LENS PLACEMENT (IOC);  Surgeon: Tonny Branch, MD;  Location: [facility];  Service: Ophthalmology;  Laterality: Left;  CDE: 7.34  . CATARACTS    . CHOLECYSTECTOMY    . INGUINAL HERNIA REPAIR         Home Medications    Prior to Admission medications   Medication Sig Start Date End Date Taking? Authorizing Provider  albuterol (PROVENTIL) (2.5 MG/3ML) 0.083% nebulizer solution USE 1 VIAL IN NEBULIZER EVERY 6 HOURS AS NEEDED. 08/29/16   Baird Lyons D, MD  aspirin EC 81 MG tablet  Take 81 mg by mouth daily as needed for moderate pain.     [provider]  cephALEXin (KEFLEX) 500 MG capsule Take 1 capsule (500 mg total) by mouth 4 (four) times daily. For 7 days 09/05/16   Naji Mehringer, PA-C  glipiZIDE (GLUCOTROL) 5 MG tablet Take 5 mg by mouth 2 (two) times daily before a meal.      [provider]  isosorbide mononitrate (IMDUR) 60 MG 24 hr tablet TAKE ONE TABLET BY MOUTH ONCE DAILY. 08/01/16   Belva Crome, MD  latanoprost (XALATAN) 0.005 % ophthalmic solution Place 1 drop into both eyes At bedtime. 12/31/11   [provider]  ONE TOUCH ULTRA TEST test strip Use as directed daily. 06/15/15   [provider]  Jonetta Speak LANCETS 01S MISC Use as directed daily. 06/15/15   [provider]  PROAIR HFA 108 (90 Base) MCG/ACT inhaler Inhale 1-2 puffs into the lungs every 6 (six) hours as needed for wheezing or shortness of breath. 05/23/16   [provider]  saxagliptin HCl (ONGLYZA) 2.5 MG TABS tablet Take 2.5 mg by mouth daily.    [provider]  SYMBICORT 160-4.5 MCG/ACT inhaler Inhale 2 puffs into the lungs 2 (two) times daily. RINSE MOUTH AFTER USE 01/27/13   [provider]    Family History Family History  Problem Relation Age of Onset  .  Heart disease Father   . Lung cancer Father        DIED OF LUNG CANCER  . Diabetes Brother     Social History Social History  Substance Use Topics  . Smoking status: Former Smoker    Packs/day: 2.00    Years: 50.00    Quit date: 04/02/2001  . Smokeless tobacco: Never Used  . Alcohol use Yes     Comment: rarely     Allergies   Codeine and Morphine   Review of Systems Review of Systems  Constitutional: Negative for activity change, appetite change, chills and fever.  HENT: Negative for facial swelling, sore throat and trouble swallowing.   Respiratory: Negative for chest tightness, shortness of breath and wheezing.   Musculoskeletal: Negative for  myalgias.  Skin: Positive for color change. Negative for rash and wound.       Bee sting right thumb  Neurological: Negative for dizziness, weakness, numbness and headaches.  All other systems reviewed and are negative.    Physical Exam Updated Vital Signs BP 133/76 (BP Location: Right Arm)   Pulse 87   Temp 97.6 F (36.4 C) (Oral)   Resp 18   Ht 5\' 8"  (1.727 m) Comment: Simultaneous filing. User may not have seen previous data.  Wt 72.6 kg (160 lb) Comment: Simultaneous filing. User may not have seen previous data.  SpO2 97%   BMI 24.33 kg/m   Physical Exam  Constitutional: He appears well-developed and well-nourished. No distress.  HENT:  Head: Atraumatic.  Mouth/Throat: Oropharynx is clear and moist.  Neck: Normal range of motion.  Cardiovascular: Normal rate, regular rhythm and intact distal pulses.   No murmur heard. Pulmonary/Chest: Effort normal and breath sounds normal. No respiratory distress.  Musculoskeletal: Normal range of motion. He exhibits no edema or tenderness.  Neurological: He is alert. No sensory deficit.  Skin: Capillary refill takes less than 2 seconds. There is erythema.  Focal erythema of the thenar eminence of the right thumb that extends to the distal wrist.  No induration or red streaking.   Nursing note and vitals reviewed.    ED Treatments / Results  Labs (all labs ordered are listed, but only abnormal results are displayed) Labs Reviewed - No data to display  EKG  EKG Interpretation None       Radiology No results found.  Procedures Procedures (including critical care time)  Medications Ordered in ED Medications - No data to display   Initial Impression / Assessment and Plan / ED Course  I have reviewed the triage vital signs and the nursing notes.  Pertinent labs & imaging results that were available during my care of the patient were reviewed by me and considered in my medical decision making (see chart for details).       Patient will appearing. Vital signs are stable. Focal erythema at the bite site, likely related to inflammatory process. No motor deficits, NV intact, no edema.  Patient agrees to abx and OTC hydrocortisone cream and to continue benadryl. Return precautions discussed and discussed pt care with Dr. Roderic Palau prior to pt discharge.  Final Clinical Impressions(s) / ED Diagnoses   Final diagnoses:  Bee sting, accidental or unintentional, initial encounter    New Prescriptions Discharge Medication List as of 09/05/2016  1:47 PM    START taking these medications   Details  cephALEXin (KEFLEX) 500 MG capsule Take 1 capsule (500 mg total) by mouth 4 (four) times daily. For 7 days, Starting Wed 09/05/2016,  Print         Kem Parkinson, PA-C 09/05/16 1421    Milton Ferguson, MD 09/05/16 [phone]

## 2016-09-27 ENCOUNTER — Encounter: Payer: Self-pay | Admitting: Endocrinology

## 2016-09-27 ENCOUNTER — Ambulatory Visit (INDEPENDENT_AMBULATORY_CARE_PROVIDER_SITE_OTHER): Payer: Medicare Other | Admitting: Endocrinology

## 2016-09-27 VITALS — BP 132/84 | HR 76 | Ht 68.0 in | Wt 165.0 lb

## 2016-09-27 DIAGNOSIS — E119 Type 2 diabetes mellitus without complications: Secondary | ICD-10-CM

## 2016-09-27 MED ORDER — BASAGLAR KWIKPEN 100 UNIT/ML ~~LOC~~ SOPN: 10.0000 [IU] | PEN_INJECTOR | SUBCUTANEOUS | 11 refills | Status: DC

## 2016-09-27 NOTE — Progress Notes (Signed)
Subjective:    Patient ID: Stephen [name], male    DOB: 1932/06/16, 83 y.o.   MRN: [id number]  HPI pt is referred by Dr Marisue Humble, for diabetes.  Pt states DM was dx'ed in 1995; he has moderate neuropathy of the lower extremities; he has associated CAD and renal insuff; he has never been on insulin; pt says his diet and exercise are fair; he has never had pancreatitis, pancreatic surgery, severe hypoglycemia or DKA.  He takes 2 oral meds.  cbg's are in the 200's. Past Medical History:  Diagnosis Date  . Abnormal chest x-ray   . Asbestos exposure   . CAD (coronary artery disease)   . CKD (chronic kidney disease), stage III   . COPD (chronic obstructive pulmonary disease) ([place])   . DM (diabetes mellitus) ([place])   . GERD (gastroesophageal reflux disease)   . Hyperlipidemia   . Hypertension     Past Surgical History:  Procedure Laterality Date  . CARDIAC CATHETERIZATION    . CARDIAC CATHETERIZATION    . CATARACT EXTRACTION W/PHACO Left 09/21/2013   Procedure: CATARACT EXTRACTION PHACO AND INTRAOCULAR LENS PLACEMENT (IOC);  Surgeon: Tonny Branch, MD;  Location: [facility];  Service: Ophthalmology;  Laterality: Left;  CDE: 7.34  . CATARACTS    . CHOLECYSTECTOMY    . INGUINAL HERNIA REPAIR      Social History   Social History  . Marital status: Married    Spouse name: N/A  . Number of children: 2  . Years of education: N/A   Occupational History  . Not on file.   Social History Main Topics  . Smoking status: Former Smoker    Packs/day: 2.00    Years: 50.00    Quit date: 04/02/2001  . Smokeless tobacco: Never Used  . Alcohol use Yes     Comment: rarely  . Drug use: No  . Sexual activity: Not on file   Other Topics Concern  . Not on file   Social History Narrative   WIFE HAS LUNG CA    Current Outpatient Prescriptions on File Prior to Visit  Medication Sig Dispense Refill  . albuterol (PROVENTIL) (2.5 MG/3ML) 0.083% nebulizer solution USE 1 VIAL IN NEBULIZER EVERY 6 HOURS  AS NEEDED. 375 mL 0  . aspirin EC 81 MG tablet Take 81 mg by mouth daily as needed for moderate pain.     Marland Kitchen glipiZIDE (GLUCOTROL) 5 MG tablet Take 5 mg by mouth 2 (two) times daily before a meal.      . isosorbide mononitrate (IMDUR) 60 MG 24 hr tablet TAKE ONE TABLET BY MOUTH ONCE DAILY. 30 tablet 11  . latanoprost (XALATAN) 0.005 % ophthalmic solution Place 1 drop into both eyes At bedtime.    . ONE TOUCH ULTRA TEST test strip Use as directed daily.    Glory Rosebush DELICA LANCETS 34L MISC Use as directed daily.    Marland Kitchen PROAIR HFA 108 (90 Base) MCG/ACT inhaler Inhale 1-2 puffs into the lungs every 6 (six) hours as needed for wheezing or shortness of breath.    . saxagliptin HCl (ONGLYZA) 2.5 MG TABS tablet Take 2.5 mg by mouth daily.    . SYMBICORT 160-4.5 MCG/ACT inhaler Inhale 2 puffs into the lungs 2 (two) times daily. RINSE MOUTH AFTER USE     No current facility-administered medications on file prior to visit.     Allergies  Allergen Reactions  . Codeine     REACTION: nausea  . Morphine Other (See Comments)  Caused hypersensitivity. Patient states it "really made me high"    Family History  Problem Relation Age of Onset  . Heart disease Father   . Lung cancer Father        DIED OF LUNG CANCER  . Diabetes Brother     BP 132/84   Pulse 76   Ht 5\' 8"  (1.727 m)   Wt 165 lb (74.8 kg)   SpO2 96%   BMI 25.09 kg/m    Review of Systems denies blurry vision, headache, chest pain, n/v, and excessive diaphoresis. He has lost 40 lbs x a few years.  He has doe, memory loss, leg cramps, cold intolerance, rhinorrhea, easy bruising, and nocturia.      Objective:   Physical Exam VS: see vs page GEN: no distress HEAD: head: no deformity eyes: no periorbital swelling, no proptosis external nose and ears are normal mouth: no lesion seen.  NECK: supple, thyroid is not enlarged CHEST WALL: no deformity LUNGS: clear to auscultation CV: reg rate and rhythm, no murmur.   ABD: abdomen  is soft, nontender.  no hepatosplenomegaly.  not distended.  no hernia MUSCULOSKELETAL: muscle bulk and strength are grossly normal.  no obvious joint swelling.  gait is normal and steady EXTEMITIES: no deformity.  no ulcer on the feet.  feet are of normal color and temp.  1+ bilat leg edema.  There is bilateral onychomycosis of the toenails.   PULSES: dorsalis pedis intact bilat.  no carotid bruit NEURO:  cn 2-12 grossly intact.   readily moves all 4's.  sensation is intact to touch on the feet, but decreased from normal.   SKIN:  Normal texture and temperature.  No rash or suspicious lesion is visible.  NODES:  None palpable at the neck.   PSYCH: alert, well-oriented.  Does not appear anxious nor depressed.   A1c=10.1%  I have reviewed outside records, and summarized: Pt was noted to have severely elevated a1c, and referred here.  It was noted rx options were limited by renal insuff.  Pt said his diet was poor, and he was unlikely to improve it.   I personally reviewed electrocardiogram tracing (07/25/16): Indication: CAD Impression: NSR.  No MI.  No hypertrophy. Compared to 2017: no significant change     Assessment & Plan:  Insulin-requiring type 2 DM, with: severe exacerbation Edema: this limits rx options.  Memory loss: he is not a candidate for multiple daily injections Renal insuff: in this setting, we'll need to taper off the glipizide when glycemic control allows.   I demonstrated pen injection technique.    Patient Instructions  good diet and exercise significantly improve the control of your diabetes.  please let me know if you wish to be referred to a dietician.  high blood sugar is very risky to your health.  you should see an eye doctor and dentist every year.  It is very important to get all recommended vaccinations.  Controlling your blood pressure and cholesterol drastically reduces the damage diabetes does to your body.  Those who smoke should quit.  Please discuss  these with your doctor.  check your blood sugar twice a day.  vary the time of day when you check, between before the 3 meals, and at bedtime.  also check if you have symptoms of your blood sugar being too high or too low.  please keep a record of the readings and bring it to your next appointment here (or you can bring the meter itself).  You can write it on any piece of paper.  please call us sooner if your blood sugar goes below 70, or if you have a lot of readings over 200. I have sent a prescription to your pharmacy, to start 10 units each morning.  Please continue the same glipizide for now, but we'll taper this off with time.  Please call us next week, to tell us how the blood sugar is doing.   Please come back for a follow-up appointment in 3 months.

## 2016-09-27 NOTE — Patient Instructions (Signed)
good diet and exercise significantly improve the control of your diabetes.  please let me know if you wish to be referred to a dietician.  high blood sugar is very risky to your health.  you should see an eye doctor and dentist every year.  It is very important to get all recommended vaccinations.  Controlling your blood pressure and cholesterol drastically reduces the damage diabetes does to your body.  Those who smoke should quit.  Please discuss these with your doctor.  check your blood sugar twice a day.  vary the time of day when you check, between before the 3 meals, and at bedtime.  also check if you have symptoms of your blood sugar being too high or too low.  please keep a record of the readings and bring it to your next appointment here (or you can bring the meter itself).  You can write it on any piece of paper.  please call us sooner if your blood sugar goes below 70, or if you have a lot of readings over 200. I have sent a prescription to your pharmacy, to start 10 units each morning.  Please continue the same glipizide for now, but we'll taper this off with time.  Please call us next week, to tell us how the blood sugar is doing.   Please come back for a follow-up appointment in 3 months.

## 2016-10-04 ENCOUNTER — Telehealth: Payer: Self-pay | Admitting: Endocrinology

## 2016-10-04 NOTE — Telephone Encounter (Signed)
See message and please advise, Thanks!  

## 2016-10-04 NOTE — Telephone Encounter (Signed)
Patient notified of message and had no further questions at this time. Patient will call back next week to report blood sugar readings.

## 2016-10-04 NOTE — Telephone Encounter (Signed)
Patient called to advise that his blood sugar has been running in the 200s-300s. Call patient as soon as possible to advise.

## 2016-10-04 NOTE — Telephone Encounter (Signed)
please call patient: Increase insulin to 20 units qam. Please call us next week, to tell us how the blood sugar is doing

## 2016-10-08 ENCOUNTER — Telehealth: Payer: Self-pay | Admitting: Endocrinology

## 2016-10-08 NOTE — Telephone Encounter (Signed)
Patient calling to discuss his updated numbers with the nurse, he was advised to call when there is a change. Okay to leave a detailed message on phone.

## 2016-10-08 NOTE — Telephone Encounter (Signed)
Increase basaglar to 30 units qam Reduce glipizide to qam only Please call us in a few days, to tell us how the blood sugar is doing.

## 2016-10-08 NOTE — Telephone Encounter (Signed)
I contacted the patient. He wanted to report his recent blood sugar readings.  10/05/2016  Fasting 196  11:15 am: 305  10/06/2016: Fasting: 170  10/07/2016: Fasting: 170  10/08/2016: Fasting: 156  Patient confirmed he is taking 20 units of basaglar in the morning.  Please advise, Thanks!

## 2016-10-08 NOTE — Telephone Encounter (Signed)
Patient notified of instructions and he voiced understanding. He has no further questions at this time.

## 2016-10-11 ENCOUNTER — Telehealth: Payer: Self-pay | Admitting: Family Medicine

## 2016-10-11 DIAGNOSIS — E119 Type 2 diabetes mellitus without complications: Secondary | ICD-10-CM

## 2016-10-11 MED ORDER — BASAGLAR KWIKPEN 100 UNIT/ML ~~LOC~~ SOPN: 40.0000 [IU] | PEN_INJECTOR | SUBCUTANEOUS | 3 refills | Status: DC

## 2016-10-11 NOTE — Telephone Encounter (Signed)
See message and please advise, Thanks!  

## 2016-10-11 NOTE — Telephone Encounter (Signed)
D/c glipizide Increase insulin to 40 units qam Please call us next week, to tell us how the blood sugar is doing.

## 2016-10-11 NOTE — Telephone Encounter (Signed)
Patient notified of message. He voiced understanding and had no further questions.

## 2016-10-11 NOTE — Telephone Encounter (Signed)
July 9th   Morning  156  Night  425  July 10th   Morning  207   Night   302   Juky 11th   Morning  183  Night   224  July 12th   Morning 150   Please call patient back at earliest convenience to possibly adjust script.  Thank you,  -LL

## 2016-10-15 ENCOUNTER — Telehealth: Payer: Self-pay | Admitting: Endocrinology

## 2016-10-15 NOTE — Telephone Encounter (Signed)
Patient calling to report blood sugar numbers: 10-12-16: 137 AM, 271 PM 10-13-16: 170 AM, 208 PM 10-14-16: 138 AM, 241 PM 10-15-16: 98 AM  Patient would like a phone call back sometime in the afternoon if possible.

## 2016-10-15 NOTE — Telephone Encounter (Signed)
Ok, then Increase basaglar to 50 units qam

## 2016-10-15 NOTE — Telephone Encounter (Signed)
I contacted the patient and on 10/11/2016 the patient was advised of these instructions and has been doing so since 10/11/2016. Please clarify instructions.  Thanks!

## 2016-10-15 NOTE — Telephone Encounter (Signed)
Patient notified of message and voiced understanding.  

## 2016-10-15 NOTE — Telephone Encounter (Signed)
Attempted to reach the patient. Patient was not available and did not have a working voicemail. Will try again at a later time.  

## 2016-10-15 NOTE — Telephone Encounter (Signed)
Increase basaglar to 40 units qam D/c glipizide.

## 2016-10-19 ENCOUNTER — Telehealth: Payer: Self-pay | Admitting: Endocrinology

## 2016-10-19 MED ORDER — INSULIN ISOPHANE HUMAN 100 UNIT/ML KWIKPEN: 50.0000 [IU] | PEN_INJECTOR | SUBCUTANEOUS | 11 refills | Status: DC

## 2016-10-19 NOTE — Telephone Encounter (Signed)
Routing to you °

## 2016-10-19 NOTE — Telephone Encounter (Signed)
Breakfast                     Bedtime  Tuesday July 17th 2018                         132                   216  Wednesday 18th          93                   211  Thursday 19th           93                   309   Friday 20th           91   Ok to leave  DETAILED message on home phone.  -LL

## 2016-10-19 NOTE — Telephone Encounter (Signed)
Patient notified and voiced understanding.

## 2016-10-19 NOTE — Telephone Encounter (Signed)
Patient awaiting a call from clinical staff about the note below, called to check the status on the note. Please advise.

## 2016-10-19 NOTE — Telephone Encounter (Signed)
Thank you. Based on the pattern of your blood sugars, we need to change to "NPH."  This works faster, thus helping more later in the day, and does not hang around to the next morning.  I have sent a prescription to your pharmacy.

## 2016-10-22 NOTE — Telephone Encounter (Signed)
This is cheapest--buy at [facility] for $25/bottle.

## 2016-10-22 NOTE — Telephone Encounter (Signed)
Patient called wanting to know if we could send an alternative in for the Humulin N? Patient stated he is in the donut hole and will have to pay 107$ every month until the end of the year.

## 2016-10-22 NOTE — Telephone Encounter (Signed)
Called patient and let him know about the cheaper cost at [facility] [facility]. He was wondering if he should split his dose of 50 units to twice a day? 25 units in the morning and 25 units in the evenings?

## 2016-10-22 NOTE — Telephone Encounter (Signed)
Patient calling to speak with clinical staff, has questions about medication that was recently changed. Asked for return call as soon as possible.

## 2016-10-22 NOTE — Telephone Encounter (Signed)
That depends on the pattern of your blood sugar throughout the day.  Please let us know ow this goes on the NPH.

## 2016-10-23 NOTE — Telephone Encounter (Signed)
Called patient and he will call back later in the week with blood sugar readings.

## 2016-10-26 ENCOUNTER — Telehealth: Payer: Self-pay

## 2016-10-26 MED ORDER — INSULIN NPH ISOPHANE & REGULAR (70-30) 100 UNIT/ML ~~LOC~~ SUSP: 40.0000 [IU] | Freq: Every day | SUBCUTANEOUS | 11 refills | Status: DC

## 2016-10-26 NOTE — Telephone Encounter (Signed)
i'm afraid that splitting the insulin would make it too low in the morning.  You should consider changing to "70/30," insulin with breakfast only.  Ok to change?

## 2016-10-26 NOTE — Telephone Encounter (Signed)
Patient wants to verify that the insulin is going to be sent to   [place], [place] [address] (Phone) [phone] (Fax)

## 2016-10-26 NOTE — Telephone Encounter (Signed)
Called and advised patient of the note from [place] regarding the 70/30 insulin, he agreed to switch.

## 2016-10-26 NOTE — Telephone Encounter (Signed)
The pattern of cbg's says this is too slow-acting for you.

## 2016-10-26 NOTE — Telephone Encounter (Signed)
I have sent a prescription to your pharmacy, for the 70/30.  We are starting with just 40 units with breakfast.  This is probably not enough, so please call or message [place] next week, to tell us how the blood sugar is doing.

## 2016-10-26 NOTE — Telephone Encounter (Signed)
See blood sugars below. Thank you!

## 2016-10-26 NOTE — Telephone Encounter (Signed)
Called and advised of the note from [place], patient request to be put back on Lantus, submitted that request to MD to advise.

## 2016-10-26 NOTE — Telephone Encounter (Signed)
Morning                      Night  Tuesday July 24th    95   147   Wednesday 25th   103   276  Thursday 26th    130   335   Friday 27th     208  Patient feels the old insulin worked better.  Please advise.  Thank you,  -LL

## 2016-10-26 NOTE — Telephone Encounter (Signed)
Patient wants to be on Lantus, please advise.  Thank you!

## 2016-10-26 NOTE — Telephone Encounter (Signed)
He is okay to switch to the 70/30, what dose. Thanks!

## 2016-10-29 NOTE — Telephone Encounter (Signed)
Patient notified of message and voiced understanding.  

## 2016-11-01 ENCOUNTER — Telehealth: Payer: Self-pay

## 2016-11-01 NOTE — Telephone Encounter (Signed)
He doesn't want to do that because of him being currently in the donut hole.

## 2016-11-01 NOTE — Telephone Encounter (Signed)
Called patient and he stated that he was in the donut hole and couldn't afford any other insulin. He said what he has cost him $500 for 3 month supply. What would be the added insulin?

## 2016-11-01 NOTE — Telephone Encounter (Signed)
If you can afford it, you could take "humalog 50/50," from a pen, but it is expensive.

## 2016-11-01 NOTE — Telephone Encounter (Signed)
Patient does not want to switch because him and his wife are both shaky & doesn't feel he could do the vials. He wanted to know if he could just increase the 70/30 to 50 units as opposed to the 40 units he's been taking?

## 2016-11-01 NOTE — Telephone Encounter (Signed)
please call patient: Despite the change to 70/30, your blood sugar is still higher later in the day.  The next step would be to take 2 different insulins, both with breakfast.  The price is the same.  OK to change?

## 2016-11-01 NOTE — Telephone Encounter (Signed)
This is the cheapest insulin.  The "70/30" you take now is 70% NPH and 30% regular. Based on the pattern of your blood sugar, you look like you need approx 50% of each.  They no longer make a 50/50 mixture, so you would need to buy separately.  The price is still $25 per bottle.  Please let meknow if you want to switch.

## 2016-11-01 NOTE — Telephone Encounter (Signed)
Patient called in with his blood sugar levels.  7/30- @7 :30 am; 108 @ 9:45 pm; 240  7/31- @8 :45am; 141 @ 8:00 pm; 334  8/1- @ 9:00 am; 144 @ 9:45 pm; 288  8/2- @ 9:30 am  Please advise if any changes need to be made. Thank you!

## 2016-11-02 MED ORDER — INSULIN NPH (HUMAN) (ISOPHANE) 100 UNIT/ML ~~LOC~~ SUSP: 20.0000 [IU] | SUBCUTANEOUS | 11 refills | Status: DC

## 2016-11-02 MED ORDER — INSULIN REGULAR HUMAN 100 UNIT/ML IJ SOLN: 20.0000 [IU] | Freq: Every day | INTRAMUSCULAR | 11 refills | Status: DC

## 2016-11-02 NOTE — Telephone Encounter (Signed)
Ok, I have sent a prescription to your pharmacy, to change 70/30 insulin to separate prescriptions for R and N, 20 units of each, both with breakfast.   Please call or message [place] next week, to tell us how the blood sugar is doing

## 2016-11-02 NOTE — Telephone Encounter (Signed)
Called and moved patients appt. To 8/16 so they could talk to Dr. Loanne Drilling sooner. Patient stated he already stopped the onglyza.

## 2016-11-02 NOTE — Telephone Encounter (Signed)
He won't spend any more money either. They were adamant about that.

## 2016-11-02 NOTE — Telephone Encounter (Signed)
Pt is calling back to see if we have received any next steps about pt's insulin at this time. Please advise

## 2016-11-02 NOTE — Telephone Encounter (Signed)
The available options are the more expensive insulin from a pen, or the cheaper from a vial.  Please choose and let us know.  Also, you should stop the onglyza to save money.

## 2016-11-02 NOTE — Telephone Encounter (Signed)
I am happy to prescribe the pen, but it is expensive.  Please let us know.

## 2016-11-02 NOTE — Telephone Encounter (Signed)
He doesn't want to do either of those because they don't come in a pen. He states him & his wife are too shaky for syringes.

## 2016-11-13 ENCOUNTER — Other Ambulatory Visit: Payer: Self-pay | Admitting: Internal Medicine

## 2016-11-14 MED ORDER — ALBUTEROL SULFATE (2.5 MG/3ML) 0.083% IN NEBU: INHALATION_SOLUTION | RESPIRATORY_TRACT | 0 refills | Status: DC

## 2016-11-14 NOTE — Addendum Note (Signed)
Addended by: Desmond Dike C on: 11/14/2016 12:10 PM   Modules accepted: Orders

## 2016-11-15 ENCOUNTER — Encounter: Payer: Self-pay | Admitting: Endocrinology

## 2016-11-15 ENCOUNTER — Ambulatory Visit (INDEPENDENT_AMBULATORY_CARE_PROVIDER_SITE_OTHER): Payer: Medicare Other | Admitting: Endocrinology

## 2016-11-15 VITALS — BP 128/62 | HR 75 | Wt 168.2 lb

## 2016-11-15 DIAGNOSIS — E1122 Type 2 diabetes mellitus with diabetic chronic kidney disease: Secondary | ICD-10-CM | POA: Diagnosis not present

## 2016-11-15 DIAGNOSIS — N183 Chronic kidney disease, stage 3 unspecified: Secondary | ICD-10-CM

## 2016-11-15 DIAGNOSIS — Z794 Long term (current) use of insulin: Secondary | ICD-10-CM | POA: Diagnosis not present

## 2016-11-15 MED ORDER — INSULIN ISOPHANE & REGULAR (HUMAN 70-30)100 UNIT/ML KWIKPEN: 45.0000 [IU] | PEN_INJECTOR | Freq: Every day | SUBCUTANEOUS | 11 refills | Status: DC

## 2016-11-15 NOTE — Patient Instructions (Addendum)
Please: take the humulin 70/30, 45 units with breakfast, and: Avoid the onglyza and glipizide. On this type of insulin schedule, you should eat meals on a regular schedule.  If a meal is missed or significantly delayed, your blood sugar could go low.  Please call or message [place] next week, to tell us how the blood sugar is doing check your blood sugar 4 times a day: before the 3 meals, and at bedtime.  also check if you have symptoms of your blood sugar being too high or too low.  please keep a record of the readings and bring it to your next appointment here (or you can bring the meter itself).  You can write it on any piece of paper.  please call us sooner if your blood sugar goes below 70, or if you have a lot of readings over 200.   Please come back for a follow-up appointment in 1 month.

## 2016-11-15 NOTE — Progress Notes (Signed)
Subjective:    Patient ID: Stephen [name], male    DOB: 1932/05/24, 83 y.o.   MRN: [id number]  HPI Pt returns for f/u of diabetes mellitus: DM type: Insulin-requiring type 2 DM.   Dx'ed: 1517 Complications: polyneuropathy, CAD, and renal insuff Therapy: insulin since 2018 DKA: never Severe hypoglycemia: never Pancreatitis: never Pancreatic imaging: normal on 2018 [place]. Other: he is not a candidate for multiple daily injections, due to memory loss Interval history: He brings a record of his cbg's which I have reviewed today.  It varies from 68-300's.  It is in general higher as the day goes on.  He takes 70/30, 50 units qam.  He sometimes take hs-glipizide.  When not taking glipizide, it varies from 120-200's.  he has been adjusting insulin dosage between 40 and 50 units qam. Past Medical History:  Diagnosis Date  . Abnormal chest x-ray   . Asbestos exposure   . CAD (coronary artery disease)   . CKD (chronic kidney disease), stage III   . COPD (chronic obstructive pulmonary disease) ([place])   . DM (diabetes mellitus) ([place])   . GERD (gastroesophageal reflux disease)   . Hyperlipidemia   . Hypertension     Past Surgical History:  Procedure Laterality Date  . CARDIAC CATHETERIZATION    . CARDIAC CATHETERIZATION    . CATARACT EXTRACTION W/PHACO Left 09/21/2013   Procedure: CATARACT EXTRACTION PHACO AND INTRAOCULAR LENS PLACEMENT (IOC);  Surgeon: Tonny Branch, MD;  Location: [facility];  Service: Ophthalmology;  Laterality: Left;  CDE: 7.34  . CATARACTS    . CHOLECYSTECTOMY    . INGUINAL HERNIA REPAIR      Social History   Social History  . Marital status: Married    Spouse name: N/A  . Number of children: 2  . Years of education: N/A   Occupational History  . Not on file.   Social History Main Topics  . Smoking status: Former Smoker    Packs/day: 2.00    Years: 50.00    Quit date: 04/02/2001  . Smokeless tobacco: Never Used  . Alcohol use Yes     Comment: rarely  .  Drug use: No  . Sexual activity: Not on file   Other Topics Concern  . Not on file   Social History Narrative   WIFE HAS LUNG CA    Current Outpatient Prescriptions on File Prior to Visit  Medication Sig Dispense Refill  . albuterol (PROVENTIL) (2.5 MG/3ML) 0.083% nebulizer solution USE 1 VIAL IN NEBULIZER EVERY 6 HOURS AS NEEDED. 375 mL 0  . aspirin EC 81 MG tablet Take 81 mg by mouth daily as needed for moderate pain.     . isosorbide mononitrate (IMDUR) 60 MG 24 hr tablet TAKE ONE TABLET BY MOUTH ONCE DAILY. 30 tablet 11  . latanoprost (XALATAN) 0.005 % ophthalmic solution Place 1 drop into both eyes At bedtime.    . ONE TOUCH ULTRA TEST test strip Use as directed daily.    Glory Rosebush DELICA LANCETS 61Y MISC Use as directed daily.    Marland Kitchen PROAIR HFA 108 (90 Base) MCG/ACT inhaler Inhale 1-2 puffs into the lungs every 6 (six) hours as needed for wheezing or shortness of breath.    . SYMBICORT 160-4.5 MCG/ACT inhaler Inhale 2 puffs into the lungs 2 (two) times daily. RINSE MOUTH AFTER USE     No current facility-administered medications on file prior to visit.     Allergies  Allergen Reactions  . Codeine  REACTION: nausea  . Morphine Other (See Comments)    Caused hypersensitivity. Patient states it "really made me high"    Family History  Problem Relation Age of Onset  . Heart disease Father   . Lung cancer Father        DIED OF LUNG CANCER  . Diabetes type I Brother     BP 128/62   Pulse 75   Wt 168 lb 3.2 oz (76.3 kg)   SpO2 96%   BMI 25.57 kg/m   Review of Systems Denies LOC.      Objective:   Physical Exam VITAL SIGNS:  See vs page GENERAL: no distress Pulses: foot pulses are intact bilaterally.   MSK: no deformity of the feet or ankles.  CV: 1+ bilat edema of the legs Skin:  no ulcer on the feet or ankles.  normal color and temp on the feet and ankles.  Spot on left leg (poss psoriasis).   Neuro: sensation is intact to touch on the feet and ankles.         Assessment & Plan:  Insulin-requiring type 2 DM, with renal insuff: he needs insulin adjustments Noncompliance with meds: intermitt glipizide is confounding interpretation of cbg's.  He is advised to take only meds as rx'ed.  Memory loss: this continues to complicate the rx of DM.  He needs the simplest possible regimen.    Patient Instructions  Please: take the humulin 70/30, 45 units with breakfast, and: Avoid the onglyza and glipizide. On this type of insulin schedule, you should eat meals on a regular schedule.  If a meal is missed or significantly delayed, your blood sugar could go low.  Please call or message [place] next week, to tell us how the blood sugar is doing check your blood sugar 4 times a day: before the 3 meals, and at bedtime.  also check if you have symptoms of your blood sugar being too high or too low.  please keep a record of the readings and bring it to your next appointment here (or you can bring the meter itself).  You can write it on any piece of paper.  please call us sooner if your blood sugar goes below 70, or if you have a lot of readings over 200.   Please come back for a follow-up appointment in 1 month.

## 2016-11-21 ENCOUNTER — Telehealth: Payer: Self-pay | Admitting: Endocrinology

## 2016-11-21 ENCOUNTER — Telehealth: Payer: Self-pay

## 2016-11-21 NOTE — Telephone Encounter (Signed)
Notified patient.

## 2016-11-21 NOTE — Telephone Encounter (Signed)
please call patient: I got your cbg record. Please increase the insulin to 48 units each morning.  On this type of insulin schedule, you should eat meals on a regular schedule.  If a meal is missed or significantly delayed, your blood sugar could go low.  I'll see you next time.

## 2016-11-21 NOTE — Telephone Encounter (Signed)
Patient brought in a weeks worth of blood sugar readings this morning and returned to find out what the plan for medication is going to be - patient stated this is what he was told to do so that the doctor can decide based on what his readings have been for a week- I made the patient aware that Dr. Loanne Drilling will be seeing patients all day and he will need time to view the log- once this has been done a nurse will call him with the recommendations- patient verbalized an understanding

## 2016-11-21 NOTE — Telephone Encounter (Signed)
Tried to call patient but got no answer.

## 2016-12-13 ENCOUNTER — Telehealth: Payer: Self-pay | Admitting: Endocrinology

## 2016-12-13 NOTE — Telephone Encounter (Signed)
Called and spoke with patient's wife. We have paperwork & is being filled out. They will pick it up next Thursday at their appt.

## 2016-12-13 NOTE — Telephone Encounter (Signed)
Patient's wife calling to see about form that was dropped off. Patient states it was left along with a self addressed envelope to be sent back. Please call patient and advise.

## 2016-12-18 ENCOUNTER — Encounter: Payer: Self-pay | Admitting: Endocrinology

## 2016-12-18 ENCOUNTER — Ambulatory Visit (INDEPENDENT_AMBULATORY_CARE_PROVIDER_SITE_OTHER): Payer: Medicare Other | Admitting: Endocrinology

## 2016-12-18 VITALS — BP 124/76 | HR 75 | Wt 168.8 lb

## 2016-12-18 DIAGNOSIS — N183 Chronic kidney disease, stage 3 (moderate): Secondary | ICD-10-CM | POA: Diagnosis not present

## 2016-12-18 DIAGNOSIS — Z794 Long term (current) use of insulin: Secondary | ICD-10-CM

## 2016-12-18 DIAGNOSIS — E1122 Type 2 diabetes mellitus with diabetic chronic kidney disease: Secondary | ICD-10-CM

## 2016-12-18 LAB — POCT GLYCOSYLATED HEMOGLOBIN (HGB A1C): Hemoglobin A1C: 7.6

## 2016-12-18 MED ORDER — INSULIN LISPRO PROT & LISPRO (50-50 MIX) 100 UNIT/ML KWIKPEN: 45.0000 [IU] | PEN_INJECTOR | Freq: Every day | SUBCUTANEOUS | 11 refills | Status: DC

## 2016-12-18 NOTE — Patient Instructions (Addendum)
Please change the humulin 70/30 to humalog 50/50, 45 units with breakfast, and: On this type of insulin schedule, you should eat meals on a regular schedule.  If a meal is missed or significantly delayed, your blood sugar could go low.  check your blood sugar 4 times a day: before the 3 meals, and at bedtime.  also check if you have symptoms of your blood sugar being too high or too low.  please keep a record of the readings and bring it to your next appointment here (or you can bring the meter itself).  You can write it on any piece of paper.  please call us sooner if your blood sugar goes below 70, or if you have a lot of readings over 200.   Please come back for a follow-up appointment in 2 months.

## 2016-12-18 NOTE — Progress Notes (Signed)
Subjective:    Patient ID: Stephen [name], male    DOB: September 30, 1932, 83 y.o.   MRN: [id number]  HPI Pt returns for f/u of diabetes mellitus: DM type: Insulin-requiring type 2 DM.   Dx'ed: 4270 Complications: polyneuropathy, CAD, and renal insuff Therapy: insulin since 2018.  DKA: never Severe hypoglycemia: never Pancreatitis: never Pancreatic imaging: normal on 2018 [place]. Other: he is not a candidate for multiple daily injections, due to memory loss.  Interval history: He brings a record of his cbg's which I have reviewed today.  It varies from 94-400.  It is in general higher as the day goes on.  He takes 70/30, 48 units qam.  Past Medical History:  Diagnosis Date  . Abnormal chest x-ray   . Asbestos exposure   . CAD (coronary artery disease)   . CKD (chronic kidney disease), stage III   . COPD (chronic obstructive pulmonary disease) ([place])   . DM (diabetes mellitus) ([place])   . GERD (gastroesophageal reflux disease)   . Hyperlipidemia   . Hypertension     Past Surgical History:  Procedure Laterality Date  . CARDIAC CATHETERIZATION    . CARDIAC CATHETERIZATION    . CATARACT EXTRACTION W/PHACO Left 09/21/2013   Procedure: CATARACT EXTRACTION PHACO AND INTRAOCULAR LENS PLACEMENT (IOC);  Surgeon: Tonny Branch, MD;  Location: [facility];  Service: Ophthalmology;  Laterality: Left;  CDE: 7.34  . CATARACTS    . CHOLECYSTECTOMY    . INGUINAL HERNIA REPAIR      Social History   Social History  . Marital status: Married    Spouse name: N/A  . Number of children: 2  . Years of education: N/A   Occupational History  . Not on file.   Social History Main Topics  . Smoking status: Former Smoker    Packs/day: 2.00    Years: 50.00    Quit date: 04/02/2001  . Smokeless tobacco: Never Used  . Alcohol use Yes     Comment: rarely  . Drug use: No  . Sexual activity: Not on file   Other Topics Concern  . Not on file   Social History Narrative   WIFE HAS LUNG CA    Current  Outpatient Prescriptions on File Prior to Visit  Medication Sig Dispense Refill  . albuterol (PROVENTIL) (2.5 MG/3ML) 0.083% nebulizer solution USE 1 VIAL IN NEBULIZER EVERY 6 HOURS AS NEEDED. 375 mL 0  . aspirin EC 81 MG tablet Take 81 mg by mouth daily as needed for moderate pain.     . isosorbide mononitrate (IMDUR) 60 MG 24 hr tablet TAKE ONE TABLET BY MOUTH ONCE DAILY. 30 tablet 11  . latanoprost (XALATAN) 0.005 % ophthalmic solution Place 1 drop into both eyes At bedtime.    . ONE TOUCH ULTRA TEST test strip Use as directed daily.    Glory Rosebush DELICA LANCETS 62B MISC Use as directed daily.    Marland Kitchen PROAIR HFA 108 (90 Base) MCG/ACT inhaler Inhale 1-2 puffs into the lungs every 6 (six) hours as needed for wheezing or shortness of breath.    . SYMBICORT 160-4.5 MCG/ACT inhaler Inhale 2 puffs into the lungs 2 (two) times daily. RINSE MOUTH AFTER USE     No current facility-administered medications on file prior to visit.     Allergies  Allergen Reactions  . Codeine     REACTION: nausea  . Morphine Other (See Comments)    Caused hypersensitivity. Patient states it "really made me high"  Family History  Problem Relation Age of Onset  . Heart disease Father   . Lung cancer Father        DIED OF LUNG CANCER  . Diabetes type I Brother     BP 124/76   Pulse 75   Wt 168 lb 12.8 oz (76.6 kg)   SpO2 95%   BMI 25.67 kg/m    Review of Systems He denies hypoglycemia    Objective:   Physical Exam VITAL SIGNS:  See vs page GENERAL: no distress Pulses: foot pulses are intact bilaterally.   MSK: no deformity of the feet or ankles.  CV: 2+ bilat edema of the legs Skin:  no ulcer on the feet or ankles.  normal color and temp on the feet and ankles.   Neuro: sensation is intact to touch on the feet and ankles.   Lab Results  Component Value Date   CREATININE 1.32 09/17/2013   BUN 27 (H) 09/17/2013   NA 137 09/17/2013   K 4.6 09/17/2013   CL 96 09/17/2013   CO2 28 09/17/2013    Lab Results  Component Value Date   HGBA1C 7.6 12/18/2016      Assessment & Plan:  Insulin-requiring type 2 DM, with polyneuropathy: The pattern of his cbg's indicates he needs a faster-acting qd insulin. Renal insuff: this is the probable reason for the cbg pattern. CAD: in this setting, he should avoid hypoglycemia, so goal a1c is in the high-7's.  Patient Instructions  Please change the humulin 70/30 to humalog 50/50, 45 units with breakfast, and: On this type of insulin schedule, you should eat meals on a regular schedule.  If a meal is missed or significantly delayed, your blood sugar could go low.  check your blood sugar 4 times a day: before the 3 meals, and at bedtime.  also check if you have symptoms of your blood sugar being too high or too low.  please keep a record of the readings and bring it to your next appointment here (or you can bring the meter itself).  You can write it on any piece of paper.  please call us sooner if your blood sugar goes below 70, or if you have a lot of readings over 200.   Please come back for a follow-up appointment in 2 months.

## 2016-12-28 ENCOUNTER — Ambulatory Visit: Payer: Medicare Other | Admitting: Endocrinology

## 2017-01-08 ENCOUNTER — Telehealth: Payer: Self-pay | Admitting: Endocrinology

## 2017-01-08 NOTE — Telephone Encounter (Signed)
The [employer] pt assistance form is needing to be sent back just pg 8 please with the license # filled out. Fax back asap thank you

## 2017-01-08 NOTE — Telephone Encounter (Signed)
Called & spoke with patient's wife notifying her that I sent over page 8 of patient assistance form to [employer].

## 2017-01-15 ENCOUNTER — Telehealth: Payer: Self-pay | Admitting: Endocrinology

## 2017-01-15 ENCOUNTER — Other Ambulatory Visit: Payer: Self-pay | Admitting: Internal Medicine

## 2017-01-15 NOTE — Telephone Encounter (Signed)
Please increase the insulin to 60 units qam. Please call or message [place] in 3 days, to tell us how the blood sugar is doing

## 2017-01-15 NOTE — Telephone Encounter (Signed)
Called patient & he is concerned over his numbers the past 26 days or so. I have the last 5 days CBG"s:           AM:        PM: 10/11 166 373 10/12 173 213 10/13 166 370 10/14 201 316 10/15 206 347  He also did an average: 12+ days over 200 8+ days over 300 2+ days over 400  Please advise?

## 2017-01-15 NOTE — Telephone Encounter (Signed)
Patient need to talk to you concerning his b/s they are out of control. Please call this Number [phone]

## 2017-01-15 NOTE — Telephone Encounter (Signed)
Called and spoke with patient's wife giving her new dosage. She stated they would callback in 3 days with new CBG's.

## 2017-01-18 NOTE — Telephone Encounter (Signed)
Patient is call to give you his b/s readings three days worth

## 2017-01-18 NOTE — Telephone Encounter (Signed)
Thank you. Please continue the same insulin for now.

## 2017-01-18 NOTE — Telephone Encounter (Signed)
CBG's:  AM: PM: 10/18 [phone] 10/17 198 77 10/16 195 242

## 2017-01-21 ENCOUNTER — Telehealth: Payer: Self-pay

## 2017-01-21 NOTE — Telephone Encounter (Signed)
Called and notified of MD note, patient had no questions.

## 2017-01-23 NOTE — Telephone Encounter (Signed)
Patient asking you to call him, he stated it was very important.

## 2017-01-23 NOTE — Telephone Encounter (Signed)
Called patient & stated that he would be called when [employer] shipment of insulin was received.

## 2017-01-30 ENCOUNTER — Telehealth: Payer: Self-pay | Admitting: Endocrinology

## 2017-01-31 ENCOUNTER — Telehealth: Payer: Self-pay | Admitting: *Deleted

## 2017-01-31 NOTE — Telephone Encounter (Signed)
Patient came in and picked up his insulin that was in the Refrigerator. Patient states he also wanted to make Dr. Loanne Drilling his blood sugar reading. He states his blood sugars has been running low. It was 68 today and Monday or Tuesday it was 75 and last week it was 60,. Patient states if you have any questions you can contact him . His contact number is [phone]. Please advise. Thank you

## 2017-01-31 NOTE — Telephone Encounter (Signed)
opened in error

## 2017-01-31 NOTE — Telephone Encounter (Signed)
See message and please advise, Thanks!  

## 2017-01-31 NOTE — Telephone Encounter (Signed)
Please decrease the insulin to 50 units qam.

## 2017-01-31 NOTE — Telephone Encounter (Signed)
Patient notified of message and voiced understanding. Patient will call back on 02/04/2017 to report blood sugar readings.

## 2017-02-04 NOTE — Telephone Encounter (Signed)
Yes, please.

## 2017-02-04 NOTE — Telephone Encounter (Signed)
Pt is calling to give BS readings 11/2 AM 281; evening 319 11/3 AM 284; PM 310 11/4 AM 264; PM 291 11/5 AM 263

## 2017-02-04 NOTE — Telephone Encounter (Signed)
Called patient & he stated that he had been doing the 50 units? Should I call him back to tell him to increase to 55 units?

## 2017-02-04 NOTE — Telephone Encounter (Addendum)
please verify current insulin is 50/50 insulin, 45 units with breakfast.  Then increase to 50 units with breakfast.  Please call or message [place] in a few days, to tell us how the blood sugar is doing

## 2017-02-04 NOTE — Telephone Encounter (Signed)
Called and notified patient to go up to 55 units. He stated that he would callback in a few days with CBG's.

## 2017-02-08 ENCOUNTER — Telehealth: Payer: Self-pay | Admitting: Endocrinology

## 2017-02-08 NOTE — Telephone Encounter (Signed)
Patient called with his numbers:  02/05/17  a.m.= 215, pm = 472  02/06/17  am=244, pm = 220  02/07/17  am = 228, pm = 164  11/9  am = 206, pm = 145

## 2017-02-08 NOTE — Telephone Encounter (Signed)
Ok, please increase to 60 units qam. Please call or message [place] next week, to tell us how the blood sugar is doing

## 2017-02-11 NOTE — Telephone Encounter (Signed)
I called & patient stated that he had been sick with a cold. His wife was worried & does not want to change insulin dose until he is better because she is concerned of lows. Patient is scheduled to see you next week so they want to discuss then before bumping up dose to 60 units.

## 2017-02-11 NOTE — Telephone Encounter (Signed)
ok 

## 2017-02-18 ENCOUNTER — Ambulatory Visit: Payer: Medicare Other | Admitting: Endocrinology

## 2017-02-18 ENCOUNTER — Encounter: Payer: Self-pay | Admitting: Endocrinology

## 2017-02-18 VITALS — BP 134/70 | HR 81 | Wt 169.2 lb

## 2017-02-18 DIAGNOSIS — E1122 Type 2 diabetes mellitus with diabetic chronic kidney disease: Secondary | ICD-10-CM

## 2017-02-18 DIAGNOSIS — N183 Chronic kidney disease, stage 3 unspecified: Secondary | ICD-10-CM

## 2017-02-18 DIAGNOSIS — Z794 Long term (current) use of insulin: Secondary | ICD-10-CM | POA: Diagnosis not present

## 2017-02-18 LAB — POCT GLYCOSYLATED HEMOGLOBIN (HGB A1C): HEMOGLOBIN A1C: 7.5

## 2017-02-18 MED ORDER — INSULIN LISPRO PROT & LISPRO (50-50 MIX) 100 UNIT/ML KWIKPEN: PEN_INJECTOR | SUBCUTANEOUS | 11 refills | Status: DC

## 2017-02-18 NOTE — Progress Notes (Signed)
Subjective:    Patient ID: Stephen [name], male    DOB: 25-May-1932, 83 y.o.   MRN: [id number]  HPI Pt returns for f/u of diabetes mellitus: DM type: Insulin-requiring type 2 DM.   Dx'ed: 7893 Complications: polyneuropathy, CAD, and renal insuff Therapy: insulin since 2018.  DKA: never Severe hypoglycemia: never.   Pancreatitis: never.   Pancreatic imaging: normal on 2018 [place]. Other: he is not a candidate for multiple daily injections, due to memory loss; wife injects his insulin for him.  Interval history: He brings a record of his cbg's which I have reviewed today.  It varies from 130-200's.  It is in general highest fasting.  Due to mild hypoglycemia in the afternoon, he reduced 70/30 insulin to 55 units qam.  Past Medical History:  Diagnosis Date  . Abnormal chest x-ray   . Asbestos exposure   . CAD (coronary artery disease)   . CKD (chronic kidney disease), stage III ([place])   . COPD (chronic obstructive pulmonary disease) ([place])   . DM (diabetes mellitus) ([place])   . GERD (gastroesophageal reflux disease)   . Hyperlipidemia   . Hypertension     Past Surgical History:  Procedure Laterality Date  . CARDIAC CATHETERIZATION    . CARDIAC CATHETERIZATION    . CATARACT EXTRACTION W/PHACO Left 09/21/2013   Procedure: CATARACT EXTRACTION PHACO AND INTRAOCULAR LENS PLACEMENT (IOC);  Surgeon: Tonny Branch, MD;  Location: [facility];  Service: Ophthalmology;  Laterality: Left;  CDE: 7.34  . CATARACTS    . CHOLECYSTECTOMY    . INGUINAL HERNIA REPAIR      Social History   Socioeconomic History  . Marital status: Married    Spouse name: Not on file  . Number of children: 2  . Years of education: Not on file  . Highest education level: Not on file  Social Needs  . Financial resource strain: Not on file  . Food insecurity - worry: Not on file  . Food insecurity - inability: Not on file  . Transportation needs - medical: Not on file  . Transportation needs - non-medical: Not on file    Occupational History  . Not on file  Tobacco Use  . Smoking status: Former Smoker    Packs/day: 2.00    Years: 50.00    Pack years: 100.00    Last attempt to quit: 04/02/2001    Years since quitting: 15.8  . Smokeless tobacco: Never Used  Substance and Sexual Activity  . Alcohol use: Yes    Comment: rarely  . Drug use: No  . Sexual activity: Not on file  Other Topics Concern  . Not on file  Social History Narrative   WIFE HAS LUNG CA    Current Outpatient Medications on File Prior to Visit  Medication Sig Dispense Refill  . albuterol (PROVENTIL) (2.5 MG/3ML) 0.083% nebulizer solution USE 1 VIAL IN NEBULIZER EVERY 6 HOURS AS NEEDED. 375 mL 0  . aspirin EC 81 MG tablet Take 81 mg by mouth daily as needed for moderate pain.     . isosorbide mononitrate (IMDUR) 60 MG 24 hr tablet TAKE ONE TABLET BY MOUTH ONCE DAILY. 30 tablet 11  . latanoprost (XALATAN) 0.005 % ophthalmic solution Place 1 drop into both eyes At bedtime.    . ONE TOUCH ULTRA TEST test strip Use as directed daily.    Glory Rosebush DELICA LANCETS 81O MISC Use as directed daily.    Marland Kitchen PROAIR HFA 108 (90 Base) MCG/ACT inhaler Inhale  1-2 puffs into the lungs every 6 (six) hours as needed for wheezing or shortness of breath.    . SYMBICORT 160-4.5 MCG/ACT inhaler Inhale 2 puffs into the lungs 2 (two) times daily. RINSE MOUTH AFTER USE     No current facility-administered medications on file prior to visit.     Allergies  Allergen Reactions  . Codeine     REACTION: nausea  . Morphine Other (See Comments)    Caused hypersensitivity. Patient states it "really made me high"    Family History  Problem Relation Age of Onset  . Heart disease Father   . Lung cancer Father        DIED OF LUNG CANCER  . Diabetes type I Brother     BP 134/70 (BP Location: Left Arm, Patient Position: Sitting, Cuff Size: Normal)   Pulse 81   Wt 169 lb 3.2 oz (76.7 kg)   SpO2 95%   BMI 25.73 kg/m    Review of Systems He denies  hypoglycemia    Objective:   Physical Exam VITAL SIGNS:  See vs page GENERAL: no distress Pulses: foot pulses are intact bilaterally.   MSK: no deformity of the feet or ankles.  CV: 1+ bilat edema of the legs Skin:  no ulcer on the feet or ankles.  normal color and temp on the feet and ankles.   Neuro: sensation is intact to touch on the feet and ankles.   Lab Results  Component Value Date   HGBA1C 7.5 02/18/2017   Lab Results  Component Value Date   CREATININE 1.32 09/17/2013   BUN 27 (H) 09/17/2013   NA 137 09/17/2013   K 4.6 09/17/2013   CL 96 09/17/2013   CO2 28 09/17/2013       Assessment & Plan:  Insulin-requiring type 2 DM, with renal insuff: the pattern of his cbg's indicates he needs some adjustment in his therapy.  Memory loss: he is not a candidate for multiple daily injections Renal insuff: he has long duration of action of insulin.  Patient Instructions  Please humalog 50/50, 45 units with breakfast, and 10 units with supper On this type of insulin schedule, you should eat meals on a regular schedule.  If a meal is missed or significantly delayed, your blood sugar could go low.  check your blood sugar 4 times a day: before the 3 meals, and at bedtime.  also check if you have symptoms of your blood sugar being too high or too low.  please keep a record of the readings and bring it to your next appointment here (or you can bring the meter itself).  You can write it on any piece of paper.  please call us sooner if your blood sugar goes below 70, or if you have a lot of readings over 200.   Please come back for a follow-up appointment in 3 months.

## 2017-02-18 NOTE — Patient Instructions (Addendum)
Please humalog 50/50, 45 units with breakfast, and 10 units with supper On this type of insulin schedule, you should eat meals on a regular schedule.  If a meal is missed or significantly delayed, your blood sugar could go low.  check your blood sugar 4 times a day: before the 3 meals, and at bedtime.  also check if you have symptoms of your blood sugar being too high or too low.  please keep a record of the readings and bring it to your next appointment here (or you can bring the meter itself).  You can write it on any piece of paper.  please call us sooner if your blood sugar goes below 70, or if you have a lot of readings over 200.   Please come back for a follow-up appointment in 3 months.

## 2017-02-25 ENCOUNTER — Other Ambulatory Visit: Payer: Self-pay | Admitting: Internal Medicine

## 2017-03-07 ENCOUNTER — Ambulatory Visit (INDEPENDENT_AMBULATORY_CARE_PROVIDER_SITE_OTHER)
Admission: RE | Admit: 2017-03-07 | Discharge: 2017-03-07 | Disposition: A | Discharge status: Home / Self Care | Payer: Medicare Other | Source: Ambulatory Visit | Attending: Internal Medicine | Admitting: Internal Medicine

## 2017-03-07 ENCOUNTER — Encounter: Payer: Self-pay | Admitting: Internal Medicine

## 2017-03-07 ENCOUNTER — Ambulatory Visit: Payer: Medicare Other | Admitting: Internal Medicine

## 2017-03-07 VITALS — BP 120/74 | HR 75 | Ht 68.0 in | Wt 167.6 lb

## 2017-03-07 DIAGNOSIS — J449 Chronic obstructive pulmonary disease, unspecified: Secondary | ICD-10-CM | POA: Diagnosis not present

## 2017-03-07 DIAGNOSIS — Z77090 Contact with and (suspected) exposure to asbestos: Secondary | ICD-10-CM

## 2017-03-07 MED ORDER — ALBUTEROL SULFATE (2.5 MG/3ML) 0.083% IN NEBU: INHALATION_SOLUTION | RESPIRATORY_TRACT | 12 refills | Status: DC

## 2017-03-07 NOTE — Progress Notes (Signed)
HPI  M former smoker followed for COPD with history of asbestos exposure, complicated by CAD, DM, GERD  -------------------------------------------------------------------------------------------  02/01/2016-83 year old male former smoker followed for COPD, history asbestos exposure, complicated by CAD, DM 2, GERD, glaucoma FOLLOWS FOR. pt states he is having running nose and mucus but it is clear. he tried everything OTC meds, but no relief.  He has felt stable with season change. Little or no routine cough and no chest pain or wheeze. He is comfortable with his meds used as directed and denies need for refills today.  03/07/17-  83 year old male former smoker followed for COPD, history asbestos exposure, complicated by CAD, DM 2, GERD, glaucoma ---COPD; Pt had recent cold but overall doing pretty good.  Here with his wife.  Remains abstinent from cigarettes.  Has recovered from a recent cold/bronchitis.  Does wheeze with exertion especially in cold air.  Some routine cough with clear phlegm.  Denies chest pain.  Medications reviewed including the option to get Medicaid support for nebulizer med.  ROS, see HPI + = positive Constitutional:   No-   weight loss, night sweats, fevers, chills, fatigue, lassitude. HEENT:   No-  headaches, difficulty swallowing, tooth/dental problems, sore throat,       No-  sneezing, itching, ear ache, nasal congestion, post nasal drip,  CV:  No-   chest pain, orthopnea, PND, swelling in lower extremities, anasarca, dizziness, palpitations Resp: + Shortness of breath with exertion or at rest.             + Productive cough,  No non-productive cough,  No- coughing up of blood.              No-   change in color of mucus.  No- wheezing.   Skin: No-   rash or lesions. GI:  No-   heartburn, indigestion, abdominal pain, nausea, vomiting,  GU:  MS:  No-   joint pain or swelling.   Neuro-     nothing unusual Psych:  No- change in mood or affect. No depression or  anxiety.  No memory loss.  OBJ General- Alert, Oriented, Affect-appropriate, Distress- none acute Skin- rash-none, lesions- none, excoriation- none Lymphadenopathy- none Head- atraumatic            Eyes- Gross vision intact, PERRLA, conjunctivae clear secretions            Ears- Hearing, canals-normal            Nose- Clear, no-Septal dev, mucus, polyps, erosion, perforation             Throat- Mallampati II , mucosa clear , drainage- none, tonsils- atrophic Neck- flexible , trachea midline, no stridor , thyroid nl, carotid no bruit Chest - symmetrical excursion , unlabored           Heart/CV- RRR , no murmur , no gallop  , no rub, nl s1 s2                           - JVD- none , edema- none, stasis changes- none, varices- none           Lung- + diminished unlabored, wheeze- none, cough- none , dullness-none, rub- none           Chest wall-  Abd-  Br/ Gen/ Rectal- Not done, not indicated Extrem- cyanosis- none, clubbing, none, atrophy- none, strength- nl Neuro- grossly intact to observation

## 2017-03-07 NOTE — Assessment & Plan Note (Signed)
Recent bronchitis has resolved.  Back to baseline. Plan-CXR.  Medication review.  Seek DME for nebulizer medication to get Medicare support

## 2017-03-07 NOTE — Patient Instructions (Signed)
Order- CXR    Dx COPD mixed type  Script printed for albuterol neb solution to establish through DME    Same as wife    Dx COPD mixed type  Please call as needed

## 2017-03-07 NOTE — Assessment & Plan Note (Signed)
Plan CXR for ongoing surveillance

## 2017-04-23 ENCOUNTER — Telehealth: Payer: Self-pay | Admitting: Endocrinology

## 2017-04-26 DIAGNOSIS — E119 Type 2 diabetes mellitus without complications: Secondary | ICD-10-CM | POA: Diagnosis not present

## 2017-05-07 DIAGNOSIS — Z125 Encounter for screening for malignant neoplasm of prostate: Secondary | ICD-10-CM | POA: Diagnosis not present

## 2017-05-07 DIAGNOSIS — E119 Type 2 diabetes mellitus without complications: Secondary | ICD-10-CM | POA: Diagnosis not present

## 2017-05-09 DIAGNOSIS — E1122 Type 2 diabetes mellitus with diabetic chronic kidney disease: Secondary | ICD-10-CM | POA: Diagnosis not present

## 2017-05-21 ENCOUNTER — Ambulatory Visit: Payer: Medicare Other | Admitting: Endocrinology

## 2017-05-23 DIAGNOSIS — Z712 Person consulting for explanation of examination or test findings: Secondary | ICD-10-CM | POA: Diagnosis not present

## 2017-05-23 DIAGNOSIS — E1165 Type 2 diabetes mellitus with hyperglycemia: Secondary | ICD-10-CM | POA: Diagnosis not present

## 2017-05-29 DIAGNOSIS — R197 Diarrhea, unspecified: Secondary | ICD-10-CM | POA: Diagnosis not present

## 2017-05-30 DIAGNOSIS — E119 Type 2 diabetes mellitus without complications: Secondary | ICD-10-CM | POA: Diagnosis not present

## 2017-05-30 DIAGNOSIS — L989 Disorder of the skin and subcutaneous tissue, unspecified: Secondary | ICD-10-CM | POA: Diagnosis not present

## 2017-05-30 DIAGNOSIS — D229 Melanocytic nevi, unspecified: Secondary | ICD-10-CM | POA: Diagnosis not present

## 2017-05-31 DIAGNOSIS — E1122 Type 2 diabetes mellitus with diabetic chronic kidney disease: Secondary | ICD-10-CM | POA: Diagnosis not present

## 2017-05-31 DIAGNOSIS — I209 Angina pectoris, unspecified: Secondary | ICD-10-CM | POA: Diagnosis not present

## 2017-05-31 DIAGNOSIS — N183 Chronic kidney disease, stage 3 (moderate): Secondary | ICD-10-CM | POA: Diagnosis not present

## 2017-05-31 DIAGNOSIS — E119 Type 2 diabetes mellitus without complications: Secondary | ICD-10-CM | POA: Diagnosis not present

## 2017-06-13 DIAGNOSIS — E1165 Type 2 diabetes mellitus with hyperglycemia: Secondary | ICD-10-CM | POA: Diagnosis not present

## 2017-07-01 DIAGNOSIS — R05 Cough: Secondary | ICD-10-CM | POA: Diagnosis not present

## 2017-07-01 DIAGNOSIS — R509 Fever, unspecified: Secondary | ICD-10-CM | POA: Diagnosis not present

## 2017-07-01 DIAGNOSIS — J449 Chronic obstructive pulmonary disease, unspecified: Secondary | ICD-10-CM | POA: Diagnosis not present

## 2017-07-17 DIAGNOSIS — E1122 Type 2 diabetes mellitus with diabetic chronic kidney disease: Secondary | ICD-10-CM | POA: Diagnosis not present

## 2017-07-17 DIAGNOSIS — D229 Melanocytic nevi, unspecified: Secondary | ICD-10-CM | POA: Diagnosis not present

## 2017-07-17 DIAGNOSIS — Z713 Dietary counseling and surveillance: Secondary | ICD-10-CM | POA: Diagnosis not present

## 2017-08-02 DIAGNOSIS — J449 Chronic obstructive pulmonary disease, unspecified: Secondary | ICD-10-CM | POA: Diagnosis not present

## 2017-08-14 DIAGNOSIS — N183 Chronic kidney disease, stage 3 (moderate): Secondary | ICD-10-CM | POA: Diagnosis not present

## 2017-08-14 DIAGNOSIS — E1165 Type 2 diabetes mellitus with hyperglycemia: Secondary | ICD-10-CM | POA: Diagnosis not present

## 2017-08-14 DIAGNOSIS — E1122 Type 2 diabetes mellitus with diabetic chronic kidney disease: Secondary | ICD-10-CM | POA: Diagnosis not present

## 2017-09-02 ENCOUNTER — Other Ambulatory Visit: Payer: Self-pay | Admitting: Interventional Cardiology

## 2017-09-02 DIAGNOSIS — H401112 Primary open-angle glaucoma, right eye, moderate stage: Secondary | ICD-10-CM | POA: Diagnosis not present

## 2017-09-02 DIAGNOSIS — H401121 Primary open-angle glaucoma, left eye, mild stage: Secondary | ICD-10-CM | POA: Diagnosis not present

## 2017-10-13 DIAGNOSIS — E1122 Type 2 diabetes mellitus with diabetic chronic kidney disease: Secondary | ICD-10-CM | POA: Diagnosis not present

## 2017-10-13 DIAGNOSIS — E1165 Type 2 diabetes mellitus with hyperglycemia: Secondary | ICD-10-CM | POA: Diagnosis not present

## 2017-10-13 DIAGNOSIS — E119 Type 2 diabetes mellitus without complications: Secondary | ICD-10-CM | POA: Diagnosis not present

## 2017-10-13 DIAGNOSIS — J449 Chronic obstructive pulmonary disease, unspecified: Secondary | ICD-10-CM | POA: Diagnosis not present

## 2017-10-21 DIAGNOSIS — J449 Chronic obstructive pulmonary disease, unspecified: Secondary | ICD-10-CM | POA: Diagnosis not present

## 2017-10-21 DIAGNOSIS — E782 Mixed hyperlipidemia: Secondary | ICD-10-CM | POA: Diagnosis not present

## 2017-10-21 DIAGNOSIS — M545 Low back pain: Secondary | ICD-10-CM | POA: Diagnosis not present

## 2017-10-21 DIAGNOSIS — N183 Chronic kidney disease, stage 3 (moderate): Secondary | ICD-10-CM | POA: Diagnosis not present

## 2017-10-21 DIAGNOSIS — E1122 Type 2 diabetes mellitus with diabetic chronic kidney disease: Secondary | ICD-10-CM | POA: Diagnosis not present

## 2017-10-27 ENCOUNTER — Other Ambulatory Visit: Payer: Self-pay | Admitting: Interventional Cardiology

## 2017-10-28 ENCOUNTER — Ambulatory Visit (HOSPITAL_COMMUNITY): Payer: Medicare Other | Attending: Internal Medicine

## 2017-10-28 ENCOUNTER — Encounter (HOSPITAL_COMMUNITY): Payer: Self-pay

## 2017-10-28 ENCOUNTER — Other Ambulatory Visit: Payer: Self-pay

## 2017-10-28 DIAGNOSIS — M6281 Muscle weakness (generalized): Secondary | ICD-10-CM | POA: Diagnosis not present

## 2017-10-28 DIAGNOSIS — M545 Low back pain: Secondary | ICD-10-CM | POA: Insufficient documentation

## 2017-10-28 DIAGNOSIS — G8929 Other chronic pain: Secondary | ICD-10-CM | POA: Diagnosis not present

## 2017-10-28 DIAGNOSIS — R29898 Other symptoms and signs involving the musculoskeletal system: Secondary | ICD-10-CM | POA: Diagnosis not present

## 2017-10-28 NOTE — Therapy (Signed)
[facility] [address] [place], [place], [address] Phone: [phone]   Fax:  [phone]  Physical Therapy Evaluation  Patient Details  Name: Stephen [name]. MRN: [id number] Date of Birth: [birth year] Referring Provider: Celene Squibb, MD   Encounter Date: 10/28/2017  PT End of Session - 10/28/17 1627    Visit Number  1    Number of Visits  9    Date for PT Re-Evaluation  11/25/17    Authorization Type  [employer] Medicare    Authorization Time Period  10/28/17 to 11/25/17    Authorization - Visit Number  1    Authorization - Number of Visits  10    PT Start Time  0347    PT Stop Time  1340    PT Time Calculation (min)  42 min    Activity Tolerance  Patient tolerated treatment well    Behavior During Therapy  [facility] for tasks assessed/performed       Past Medical History:  Diagnosis Date  . Abnormal chest x-ray   . Asbestos exposure   . CAD (coronary artery disease)   . CKD (chronic kidney disease), stage III ([place])   . COPD (chronic obstructive pulmonary disease) ([place])   . DM (diabetes mellitus) ([place])   . GERD (gastroesophageal reflux disease)   . Hyperlipidemia   . Hypertension     Past Surgical History:  Procedure Laterality Date  . CARDIAC CATHETERIZATION    . CARDIAC CATHETERIZATION    . CATARACT EXTRACTION W/PHACO Left 09/21/2013   Procedure: CATARACT EXTRACTION PHACO AND INTRAOCULAR LENS PLACEMENT (IOC);  Surgeon: Tonny Branch, MD;  Location: [facility];  Service: Ophthalmology;  Laterality: Left;  CDE: 7.34  . CATARACTS    . CHOLECYSTECTOMY    . INGUINAL HERNIA REPAIR      There were no vitals filed for this visit.   Subjective Assessment - 10/28/17 1304    Subjective  Pt reports having intermittent LBP across his lower back. He states that he initially injured it back in the 70s when he had 3 buldging discs. He states about 6-13months his pain returned insidiously. He denies any radicular or b/b symptoms. He states that  his pain can come on if he is sitting, standing, walking, or bending over, and these are his aggravating factors as well. His wife reports she has noticed him more bent over. He tries to stay away from lifting heavy stuff and bending over. He states that a heating pad or OTC pain meds/agents (ibuprofen, BenGay) will decrease his pain.     Patient is accompained by:  Family member    Limitations  Sitting;Lifting;Standing;House hold activities;Walking    How long can you sit comfortably?  no issues if he's not in pain; couple hours if back is in pain    How long can you stand comfortably?  does this very seldom, bothers R side of back more than L    How long can you walk comfortably?  15-67mins, limited due to breathing (COPD) and back pain    Patient Stated Goals  get rid of the back pain    Currently in Pain?  No/denies         [facility] PT Assessment - 10/28/17 0001      Assessment   Medical Diagnosis  Back Pain    Referring Provider  Celene Squibb, MD    Onset Date/Surgical Date  -- years ago initially; recent flare up 6-66months  Next MD Visit  3 months    Prior Therapy  none      Balance Screen   Has the patient fallen in the past 6 months  Yes    How many times?  1 in [place] parking lot when he turned around and just fell    Has the patient had a decrease in activity level because of a fear of falling?   No    Is the patient reluctant to leave their home because of a fear of falling?   No      Prior Function   Level of Independence  Independent    Vocation  Retired    Leisure  visiting family, walking      Observation/Other Assessments   Focus on Therapeutic Outcomes (FOTO)   to be completed next visit      Functional Tests   Functional tests  Sit to Stand      Sit to Stand   Comments  30 sec chair rise test: 10      Posture/Postural Control   Posture/Postural Control  Postural limitations    Postural Limitations  Rounded Shoulders;Forward head;Increased thoracic kyphosis       ROM / Strength   AROM / PROM / Strength  AROM;Strength      AROM   AROM Assessment Site  Lumbar    Lumbar Flexion  50% limited, in lordosis, hands just past thighs    Lumbar Extension  25% limited    Lumbar - Right Side Bend  25% limited, pain on R    Lumbar - Left Side Bend  25% limited, pain on L    Lumbar - Right Rotation  50% limited, stiffness    Lumbar - Left Rotation  75% limited, stiffness      Strength   Strength Assessment Site  Hip;Knee;Ankle    Right Hip Flexion  5/5    Right Hip Extension  2+/5    Right Hip ABduction  2+/5    Left Hip Flexion  4+/5    Left Hip Extension  2+/5    Left Hip ABduction  4/5    Right Knee Flexion  4+/5    Right Knee Extension  5/5    Left Knee Flexion  4+/5    Left Knee Extension  5/5    Right Ankle Dorsiflexion  4+/5    Left Ankle Dorsiflexion  4+/5      Flexibility   Soft Tissue Assessment /Muscle Length  yes    Hamstrings  90/90: R: 118deg L: 125deg    Quadriceps  assess Ely's test next visit      Palpation   Spinal mobility  grossly hypomobile throughout all    Palpation comment  increased soft tissue restrictions throughout lumbar paraspinals and recreated pt's pain wtih palpation      Ambulation/Gait   Gait Comments  perform 3MWT next visit      Balance   Balance Assessed  Yes      Static Standing Balance   Static Standing - Balance Support  No upper extremity supported    Static Standing Balance -  Activities   Single Leg Stance - Right Leg;Single Leg Stance - Left Leg    Static Standing - Comment/# of Minutes  R: 3sec or < L: 6 sec or <      Standardized Balance Assessment   Standardized Balance Assessment  --           Objective measurements completed on examination: See  above findings.        PT Education - 10/28/17 1627    Education Details  exam findings, POC, HEP    Person(s) Educated  Patient;Spouse    Methods  Explanation;Demonstration;Handout    Comprehension  Verbalized  understanding;Returned demonstration       PT Short Term Goals - 10/28/17 1635      PT SHORT TERM GOAL #1   Title  Pt will be independent with HEP and perform consistently in order to decrease pain.    Time  2    Period  Weeks    Status  New    Target Date  11/11/17      PT SHORT TERM GOAL #2   Title  Pt will have 1/2 grade improvement throughout BLE MMT in order to maximize gait, balance, and functional tasks.    Time  2    Period  Weeks    Status  New      PT SHORT TERM GOAL #3   Title  Pt will be able to perform 12 STS during 30sec chair rise test to demo improved functional hip strength and balance.     Time  2    Period  Weeks    Status  New      PT SHORT TERM GOAL #4   Title  Pt will have bil HS length to 130deg in order to demo improved flexibility and to maximize gait and functional mobility with less pain.    Time  2    Period  Weeks    Status  New        PT Long Term Goals - 10/28/17 1636      PT LONG TERM GOAL #1   Title  Pt will have 1 grade improvement throughout BLE MMT in order to decrease LBP and further maximize gait, balance, and functional tasks.    Time  4    Period  Weeks    Status  New    Target Date  11/25/17      PT LONG TERM GOAL #2   Title  Pt will have improved lumbar flexion by 25% or better in order to decrease his pain and maximize his ability to donn socks and shoes independently.    Time  4    Period  Weeks    Status  New      PT LONG TERM GOAL #3   Title  Pt will be able to perform bil SLS for 10 sec or > in order to demo improved functional strength and balance so as to maximize his gait and stair ambulation.    Time  4    Period  Weeks    Status  New      PT LONG TERM GOAL #4   Title  Pt will report being able to perform his usual walking program with his wife and assist with [facility] chores without LBP to demo improved core and functional strength in order to maximize his function at home.     Time  4    Period  Weeks    Status   New             Plan - 10/28/17 1633    Clinical Impression Statement  Pt is pleasant 84YO M who presents to OPPT with c/o chronic LBP with recent flare up 6-7MA. Pt currently presents with deficits in lumbar ROM, BLE MMT, flexibility, soft tissue restrictions, hypomobility, functional strength, gait, and mobility. Pt  noted to be most limited in lumbar flexion during ROM assessment. Pt also presented with tight HS, with R to 118deg and L to 125deg in 90/90 position. Pt needs skilled PT intervention to address these impairments in order to decrease pain and maximize his QOL.     Clinical Presentation  Stable    Clinical Presentation due to:  lumbar ROM, BLE MMT, core strength, flexibility, hypomobility, balance, functional strength, gait    Clinical Decision Making  Low    Rehab Potential  Good    PT Frequency  2x / week    PT Duration  4 weeks    PT Treatment/Interventions  ADLs/Self Care Home Management;Aquatic Therapy;Cryotherapy;Electrical Stimulation;Moist Heat;Ultrasound;Traction;Gait training;Stair training;Functional mobility training;Therapeutic activities;Therapeutic exercise;Balance training;Neuromuscular re-education;Patient/family education;Manual techniques;Passive range of motion;Dry needling;Taping    PT Next Visit Plan  review goals and HEP; complete FOTO and 3MWT; initiate lumbar flexion mobility work, bil hip mobility work, core, BLE, and functional strengthening     PT Home Exercise Plan  eval: supine active hamstring stretch (hands behind knee), SKTC    Consulted and Agree with Plan of Care  Patient;Family member/caregiver    Family Member Consulted  wife       Patient will benefit from skilled therapeutic intervention in order to improve the following deficits and impairments:  Decreased activity tolerance, Decreased balance, Decreased endurance, Decreased range of motion, Decreased strength, Difficulty walking, Hypomobility, Increased fascial restricitons, Increased  muscle spasms, Impaired flexibility, Improper body mechanics, Postural dysfunction, Pain  Visit Diagnosis: Chronic bilateral low back pain without sciatica - Plan: PT plan of care cert/re-cert  Muscle weakness (generalized) - Plan: PT plan of care cert/re-cert  Other symptoms and signs involving the musculoskeletal system - Plan: PT plan of care cert/re-cert     Problem List Patient Active Problem List   Diagnosis Date Noted  . Nonallergic vasomotor rhinitis 02/01/2016  . DYSPNEA 02/02/2008  . Coronary atherosclerosis 01/25/2008  . COPD mixed type ([place]) 01/25/2008  . Diabetes ([place]) 01/15/2007  . GERD 01/15/2007  . Asbestos exposure 01/15/2007       Geraldine Solar PT, DPT  [facility] [address] [place], [place], [address] Phone: [phone]   Fax:  [phone]  Name: Stephen [name]. MRN: [id number] Date of Birth: 05-03-1932

## 2017-10-30 ENCOUNTER — Ambulatory Visit: Payer: Medicare Other | Admitting: Interventional Cardiology

## 2017-10-30 ENCOUNTER — Encounter: Payer: Self-pay | Admitting: Interventional Cardiology

## 2017-10-30 VITALS — BP 118/70 | HR 72 | Ht 68.0 in | Wt 174.0 lb

## 2017-10-30 DIAGNOSIS — E785 Hyperlipidemia, unspecified: Secondary | ICD-10-CM | POA: Diagnosis not present

## 2017-10-30 DIAGNOSIS — E1122 Type 2 diabetes mellitus with diabetic chronic kidney disease: Secondary | ICD-10-CM

## 2017-10-30 DIAGNOSIS — I2510 Atherosclerotic heart disease of native coronary artery without angina pectoris: Secondary | ICD-10-CM | POA: Diagnosis not present

## 2017-10-30 DIAGNOSIS — N183 Chronic kidney disease, stage 3 unspecified: Secondary | ICD-10-CM

## 2017-10-30 DIAGNOSIS — I10 Essential (primary) hypertension: Secondary | ICD-10-CM

## 2017-10-30 DIAGNOSIS — J449 Chronic obstructive pulmonary disease, unspecified: Secondary | ICD-10-CM | POA: Diagnosis not present

## 2017-10-30 DIAGNOSIS — Z794 Long term (current) use of insulin: Secondary | ICD-10-CM

## 2017-10-30 MED ORDER — ISOSORBIDE MONONITRATE ER 60 MG PO TB24: 60.0000 mg | ORAL_TABLET | Freq: Every day | ORAL | 3 refills | Status: DC

## 2017-10-30 NOTE — Patient Instructions (Signed)
Medication Instructions:  1) DECREASE Aspirin to 81mg  three times per week  Labwork: None  Testing/Procedures: None  Follow-Up: Your physician wants you to follow-up in: 1 year with Dr. Tamala Julian.  You will receive a reminder letter in the mail two months in advance. If you don't receive a letter, please call our office to schedule the follow-up appointment.   Any Other Special Instructions Will Be Listed Below (If Applicable).     If you need a refill on your cardiac medications before your next appointment, please call your pharmacy.

## 2017-10-30 NOTE — Progress Notes (Signed)
Cardiology Office Note:    Date:  10/30/2017   ID:  Stephen Sicks., DOB 1932/12/06, MRN [id number]  PCP:  Celene Squibb, MD  Cardiologist:  No primary care provider on file.   Referring MD: Gaynelle Arabian, MD   Chief Complaint  Patient presents with  . Coronary Artery Disease    History of Present Illness:    Stephen [name]. is a 84 y.o. male with a hx of nonobstructive CAD, stage III chronic kidney disease, COPD diabetes mellitus type 2, hyperlipidemia, and essential hypertension.  His major complaint is exertional dyspnea.  He denies orthopnea and PND.  He does note right greater than left lower extremity swelling that progresses as the day goes forward.  Legs are usually not swollen first thing in the morning.  He denies angina.  He has not needed nitroglycerin.  When he was short of breath on exertion, using his inhaler helps to resolve the dyspnea.  Past Medical History:  Diagnosis Date  . Abnormal chest x-ray   . Asbestos exposure   . CAD (coronary artery disease)   . CKD (chronic kidney disease), stage III ([place])   . COPD (chronic obstructive pulmonary disease) ([place])   . DM (diabetes mellitus) ([place])   . GERD (gastroesophageal reflux disease)   . Hyperlipidemia   . Hypertension     Past Surgical History:  Procedure Laterality Date  . CARDIAC CATHETERIZATION    . CARDIAC CATHETERIZATION    . CATARACT EXTRACTION W/PHACO Left 09/21/2013   Procedure: CATARACT EXTRACTION PHACO AND INTRAOCULAR LENS PLACEMENT (IOC);  Surgeon: Tonny Branch, MD;  Location: [facility];  Service: Ophthalmology;  Laterality: Left;  CDE: 7.34  . CATARACTS    . CHOLECYSTECTOMY    . INGUINAL HERNIA REPAIR      Current Medications: Current Meds  Medication Sig  . albuterol (PROVENTIL) (2.5 MG/3ML) 0.083% nebulizer solution USE 1 VIAL IN NEBULIZER EVERY 6 HOURS AS NEEDED.  Marland Kitchen aspirin EC 81 MG tablet Take 81 mg by mouth every Monday, Wednesday, and Friday.  . insulin lispro (HUMALOG) 100  UNIT/ML injection Inject 22 Units into the skin daily.  . isosorbide mononitrate (IMDUR) 60 MG 24 hr tablet Take 1 tablet (60 mg total) by mouth daily.  Marland Kitchen latanoprost (XALATAN) 0.005 % ophthalmic solution Place 1 drop into both eyes At bedtime.  Marland Kitchen PROAIR HFA 108 (90 Base) MCG/ACT inhaler Inhale 1-2 puffs into the lungs every 6 (six) hours as needed for wheezing or shortness of breath.  . rosuvastatin (CRESTOR) 5 MG tablet Take 5 mg by mouth daily at 6 PM.   . SYMBICORT 160-4.5 MCG/ACT inhaler Inhale 2 puffs into the lungs 2 (two) times daily. RINSE MOUTH AFTER USE  . TRESIBA FLEXTOUCH 200 UNIT/ML SOPN Inject 32 Units as directed every morning.   . [DISCONTINUED] isosorbide mononitrate (IMDUR) 60 MG 24 hr tablet Take 1 tablet (60 mg total) by mouth daily. Please keep upcoming appointment for additional refills thanks.     Allergies:   Codeine and Morphine   Social History   Socioeconomic History  . Marital status: Married    Spouse name: Not on file  . Number of children: 2  . Years of education: Not on file  . Highest education level: Not on file  Occupational History  . Not on file  Social Needs  . Financial resource strain: Not on file  . Food insecurity:    Worry: Not on file    Inability: Not on file  .  Transportation needs:    Medical: Not on file    Non-medical: Not on file  Tobacco Use  . Smoking status: Former Smoker    Packs/day: 2.00    Years: 50.00    Pack years: 100.00    Last attempt to quit: 04/02/2001    Years since quitting: 16.5  . Smokeless tobacco: Never Used  Substance and Sexual Activity  . Alcohol use: Yes    Comment: rarely  . Drug use: No  . Sexual activity: Not on file  Lifestyle  . Physical activity:    Days per week: Not on file    Minutes per session: Not on file  . Stress: Not on file  Relationships  . Social connections:    Talks on phone: Not on file    Gets together: Not on file    Attends religious service: Not on file    Active  member of club or organization: Not on file    Attends meetings of clubs or organizations: Not on file    Relationship status: Not on file  Other Topics Concern  . Not on file  Social History Narrative   WIFE HAS LUNG CA     Family History: The patient's family history includes Diabetes type I in his brother; Heart disease in his father; Lung cancer in his father.  ROS:   Please see the history of present illness.    Leg swelling as mentioned above, hearing loss, vision disturbance, cough, DOE, snoring, wheezing, constipation, severe back pain, easy bruising, muscle pain, dizziness, and concern about poor diabetes control.  All other systems reviewed and are negative.  EKGs/Labs/Other Studies Reviewed:    The following studies were reviewed today: Know.  Reviewed  EKG:  EKG is  ordered today.  The ekg ordered today demonstrates sinus rhythm with poor R wave progression.  Normal PR interval.  PACs.  No significant change compared to prior.  Recent Labs: No results found for requested labs within last 8760 hours.  Recent Lipid Panel    Component Value Date/Time   CHOL  08/17/2009 0311    108        ATP III CLASSIFICATION:  <200     mg/dL   Desirable  200-239  mg/dL   Borderline High  >=240    mg/dL   High          TRIG 104 08/17/2009 0311   HDL 32 (L) 08/17/2009 0311   CHOLHDL 3.4 08/17/2009 0311   VLDL 21 08/17/2009 0311   LDLCALC  08/17/2009 0311    55        Total Cholesterol/HDL:CHD Risk Coronary Heart Disease Risk Table                     Men   Women  1/2 Average Risk   3.4   3.3  Average Risk       5.0   4.4  2 X Average Risk   9.6   7.1  3 X Average Risk  23.4   11.0        Use the calculated Patient Ratio above and the CHD Risk Table to determine the patient's CHD Risk.        ATP III CLASSIFICATION (LDL):  <100     mg/dL   Optimal  100-129  mg/dL   Near or Above                    Optimal  130-159  mg/dL   Borderline  160-189  mg/dL   High  >190      mg/dL   Very High    Physical Exam:    VS:  BP 118/70   Pulse 72   Ht 5\' 8"  (1.727 m)   Wt 174 lb (78.9 kg)   BMI 26.46 kg/m     Wt Readings from Last 3 Encounters:  10/30/17 174 lb (78.9 kg)  03/07/17 167 lb 9.6 oz (76 kg)  02/18/17 169 lb 3.2 oz (76.7 kg)     GEN: Elderly, well nourished, well developed in no acute distress HEENT: Normal NECK: No JVD. LYMPHATICS: No lymphadenopathy CARDIAC: RRR, no murmur, nogallop, trace right ankle edema with varicose region. VASCULAR: Bilateral radial and dorsalis pedis 2+ pulses.  No bruits. RESPIRATORY:  Clear to auscultation without rales, wheezing or rhonchi  ABDOMEN: Soft, non-tender, non-distended, No pulsatile mass, MUSCULOSKELETAL: No deformity  SKIN: Warm and dry NEUROLOGIC:  Alert and oriented x 3 PSYCHIATRIC:  Normal affect   ASSESSMENT:    1. Atherosclerosis of native coronary artery of native heart without angina pectoris   2. Essential hypertension   3. Hyperlipidemia with target LDL less than 70   4. COPD mixed type ([place])   5. Type 2 diabetes mellitus with stage 3 chronic kidney disease, with long-term current use of insulin ([facility])    PLAN:    In order of problems listed above:  1. No anginal complaints.  Monitor clinically.  Decrease aspirin to 81 mg Monday Wednesday Friday because of excessive ecchymoses noted. 2. Current blood pressure control.  No change required. 3. Continue statin therapy with LDL target less than 70 especially in the setting of diabetes mellitus. 4. Dyspnea on exertion is a significant limiting issues. 5. Target less than 7.  Diabetes status is improved.     Plan clinical follow-up in 1 year unless chest discomfort, rhythm disturbance, or excessive fluid accumulation.   Medication Adjustments/Labs and Tests Ordered: Current medicines are reviewed at length with the patient today.  Concerns regarding medicines are outlined above.  Orders Placed This Encounter  Procedures  . EKG 12-Lead    Meds ordered this encounter  Medications  . isosorbide mononitrate (IMDUR) 60 MG 24 hr tablet    Sig: Take 1 tablet (60 mg total) by mouth daily.    Dispense:  90 tablet    Refill:  3    Patient Instructions  Medication Instructions:  1) DECREASE Aspirin to 81mg  three times per week  Labwork: None  Testing/Procedures: None  Follow-Up: Your physician wants you to follow-up in: 1 year with Dr. Tamala Julian.  You will receive a reminder letter in the mail two months in advance. If you don't receive a letter, please call our office to schedule the follow-up appointment.   Any Other Special Instructions Will Be Listed Below (If Applicable).     If you need a refill on your cardiac medications before your next appointment, please call your pharmacy.      Signed, Sinclair Grooms, MD  10/30/2017 1:21 PM    [facility]

## 2017-11-01 ENCOUNTER — Encounter

## 2017-11-04 ENCOUNTER — Telehealth (HOSPITAL_COMMUNITY): Payer: Self-pay | Admitting: Internal Medicine

## 2017-11-04 ENCOUNTER — Ambulatory Visit (HOSPITAL_COMMUNITY): Payer: Medicare Other | Admitting: Physical Therapy

## 2017-11-04 NOTE — Telephone Encounter (Signed)
pt called and said to cancel all appts... he can't do therapy because of his breathing   11/04/17

## 2017-11-07 ENCOUNTER — Encounter (HOSPITAL_COMMUNITY): Payer: Medicare Other | Admitting: Physical Therapy

## 2017-11-11 ENCOUNTER — Encounter (HOSPITAL_COMMUNITY): Payer: Medicare Other

## 2017-11-14 ENCOUNTER — Encounter (HOSPITAL_COMMUNITY): Payer: Medicare Other

## 2017-11-18 ENCOUNTER — Encounter (HOSPITAL_COMMUNITY): Payer: Medicare Other

## 2017-11-21 ENCOUNTER — Encounter (HOSPITAL_COMMUNITY): Payer: Medicare Other

## 2017-11-29 DIAGNOSIS — J449 Chronic obstructive pulmonary disease, unspecified: Secondary | ICD-10-CM | POA: Diagnosis not present

## 2017-11-29 DIAGNOSIS — E782 Mixed hyperlipidemia: Secondary | ICD-10-CM | POA: Diagnosis not present

## 2017-11-29 DIAGNOSIS — N183 Chronic kidney disease, stage 3 (moderate): Secondary | ICD-10-CM | POA: Diagnosis not present

## 2017-11-29 DIAGNOSIS — E1122 Type 2 diabetes mellitus with diabetic chronic kidney disease: Secondary | ICD-10-CM | POA: Diagnosis not present

## 2017-12-03 DIAGNOSIS — J449 Chronic obstructive pulmonary disease, unspecified: Secondary | ICD-10-CM | POA: Diagnosis not present

## 2017-12-03 DIAGNOSIS — E1122 Type 2 diabetes mellitus with diabetic chronic kidney disease: Secondary | ICD-10-CM | POA: Diagnosis not present

## 2017-12-03 DIAGNOSIS — E782 Mixed hyperlipidemia: Secondary | ICD-10-CM | POA: Diagnosis not present

## 2018-01-24 DIAGNOSIS — J441 Chronic obstructive pulmonary disease with (acute) exacerbation: Secondary | ICD-10-CM | POA: Diagnosis not present

## 2018-01-24 DIAGNOSIS — J309 Allergic rhinitis, unspecified: Secondary | ICD-10-CM | POA: Diagnosis not present

## 2018-01-24 DIAGNOSIS — H6121 Impacted cerumen, right ear: Secondary | ICD-10-CM | POA: Diagnosis not present

## 2018-01-27 DIAGNOSIS — D229 Melanocytic nevi, unspecified: Secondary | ICD-10-CM | POA: Diagnosis not present

## 2018-01-27 DIAGNOSIS — E1122 Type 2 diabetes mellitus with diabetic chronic kidney disease: Secondary | ICD-10-CM | POA: Diagnosis not present

## 2018-01-27 DIAGNOSIS — D696 Thrombocytopenia, unspecified: Secondary | ICD-10-CM | POA: Diagnosis not present

## 2018-01-27 DIAGNOSIS — E782 Mixed hyperlipidemia: Secondary | ICD-10-CM | POA: Diagnosis not present

## 2018-01-29 DIAGNOSIS — N183 Chronic kidney disease, stage 3 (moderate): Secondary | ICD-10-CM | POA: Diagnosis not present

## 2018-01-29 DIAGNOSIS — Z0000 Encounter for general adult medical examination without abnormal findings: Secondary | ICD-10-CM | POA: Diagnosis not present

## 2018-01-29 DIAGNOSIS — H6121 Impacted cerumen, right ear: Secondary | ICD-10-CM | POA: Diagnosis not present

## 2018-01-29 DIAGNOSIS — J449 Chronic obstructive pulmonary disease, unspecified: Secondary | ICD-10-CM | POA: Diagnosis not present

## 2018-01-29 DIAGNOSIS — R945 Abnormal results of liver function studies: Secondary | ICD-10-CM | POA: Diagnosis not present

## 2018-01-29 DIAGNOSIS — E1122 Type 2 diabetes mellitus with diabetic chronic kidney disease: Secondary | ICD-10-CM | POA: Diagnosis not present

## 2018-02-04 DIAGNOSIS — E119 Type 2 diabetes mellitus without complications: Secondary | ICD-10-CM | POA: Diagnosis not present

## 2018-02-04 DIAGNOSIS — H401121 Primary open-angle glaucoma, left eye, mild stage: Secondary | ICD-10-CM | POA: Diagnosis not present

## 2018-02-04 DIAGNOSIS — H401112 Primary open-angle glaucoma, right eye, moderate stage: Secondary | ICD-10-CM | POA: Diagnosis not present

## 2018-02-04 DIAGNOSIS — Z961 Presence of intraocular lens: Secondary | ICD-10-CM | POA: Diagnosis not present

## 2018-02-05 DIAGNOSIS — J060 Acute laryngopharyngitis: Secondary | ICD-10-CM | POA: Diagnosis not present

## 2018-02-05 DIAGNOSIS — J449 Chronic obstructive pulmonary disease, unspecified: Secondary | ICD-10-CM | POA: Diagnosis not present

## 2018-02-12 DIAGNOSIS — H401112 Primary open-angle glaucoma, right eye, moderate stage: Secondary | ICD-10-CM | POA: Diagnosis not present

## 2018-02-12 DIAGNOSIS — H401121 Primary open-angle glaucoma, left eye, mild stage: Secondary | ICD-10-CM | POA: Diagnosis not present

## 2018-02-12 DIAGNOSIS — Z961 Presence of intraocular lens: Secondary | ICD-10-CM | POA: Diagnosis not present

## 2018-02-12 DIAGNOSIS — E119 Type 2 diabetes mellitus without complications: Secondary | ICD-10-CM | POA: Diagnosis not present

## 2018-03-10 ENCOUNTER — Ambulatory Visit: Payer: Medicare Other | Admitting: Internal Medicine

## 2018-03-10 ENCOUNTER — Encounter: Payer: Self-pay | Admitting: Internal Medicine

## 2018-03-10 VITALS — BP 134/72 | HR 80 | Ht 68.0 in | Wt 172.8 lb

## 2018-03-10 DIAGNOSIS — Z77090 Contact with and (suspected) exposure to asbestos: Secondary | ICD-10-CM

## 2018-03-10 DIAGNOSIS — J449 Chronic obstructive pulmonary disease, unspecified: Secondary | ICD-10-CM | POA: Diagnosis not present

## 2018-03-10 MED ORDER — BUDESONIDE-FORMOTEROL FUMARATE 160-4.5 MCG/ACT IN AERO: 2.0000 | INHALATION_SPRAY | Freq: Two times a day (BID) | RESPIRATORY_TRACT | 12 refills | Status: DC

## 2018-03-10 MED ORDER — ALBUTEROL SULFATE (2.5 MG/3ML) 0.083% IN NEBU: INHALATION_SOLUTION | RESPIRATORY_TRACT | 12 refills | Status: DC

## 2018-03-10 MED ORDER — PROAIR HFA 108 (90 BASE) MCG/ACT IN AERS: 1.0000 | INHALATION_SPRAY | Freq: Four times a day (QID) | RESPIRATORY_TRACT | 12 refills | Status: DC | PRN

## 2018-03-10 NOTE — Patient Instructions (Signed)
Refill scripts sent  Order- CXR dx COPD mixed type, hx asbestos exposure  Please call if we can help

## 2018-03-10 NOTE — Assessment & Plan Note (Signed)
He has previous tobacco and asbestos exposure puts him at higher long-term risk.  We will continue surveillance. Plan-CXR

## 2018-03-10 NOTE — Assessment & Plan Note (Signed)
Former smoker had asbestos exposure history.  He has recovered from an acute bronchitis in October and is near his baseline now.  I think we are seeing expected aging related gradual progression but his dyspnea on exertion is not much changed. Plan-CXR, refilled meds with discussion.

## 2018-03-10 NOTE — Progress Notes (Signed)
HPI  M former smoker followed for COPD with history of asbestos exposure, complicated by CAD, DM, GERD  ------------------------------------------------------------------------------------------- 03/07/17-  84 year old male former smoker followed for COPD, history asbestos exposure, complicated by CAD, DM 2, GERD, glaucoma, HBP ---COPD; Pt had recent cold but overall doing pretty good.  Here with his wife.  Remains abstinent from cigarettes.  Has recovered from a recent cold/bronchitis.  Does wheeze with exertion especially in cold air.  Some routine cough with clear phlegm.  Denies chest pain.  Medications reviewed including the option to get Medicaid support for nebulizer med.  03/10/2018- 84 year old male former smoker followed for COPD, history asbestos exposure, complicated by CAD, DM 2, GERD, glaucoma Here with wife -----pt c/o stable sob and wheezing with exertion, prod cough with clear mucus.   He describes a sustained bronchitis lasting 3 weeks after flu shot in October, now resolved.  Some dyspnea on exertion without acute event.  No exertional chest pain, discolored sputum, adenopathy or fever now. CXR 03/07/2017- IMPRESSION: Bilateral pleural plaques.  No active disease.  ROS, see HPI + = positive Constitutional:   No-   weight loss, night sweats, fevers, chills, fatigue, lassitude. HEENT:   No-  headaches, difficulty swallowing, tooth/dental problems, sore throat,       No-  sneezing, itching, ear ache, nasal congestion, post nasal drip,  CV:  No-   chest pain, orthopnea, PND, swelling in lower extremities, anasarca, dizziness, palpitations Resp: + Shortness of breath with exertion or at rest.             + Productive cough,  No non-productive cough,  No- coughing up of blood.              No-   change in color of mucus.  No- wheezing.   Skin: No-   rash or lesions. GI:  No-   heartburn, indigestion, abdominal pain, nausea, vomiting,  GU:  MS:  No-   joint pain or swelling.    Neuro-     nothing unusual Psych:  No- change in mood or affect. No depression or anxiety.  No memory loss.  OBJ General- Alert, Oriented, Affect-appropriate, Distress- none acute Skin- rash-none, lesions- none, excoriation- none Lymphadenopathy- none Head- atraumatic            Eyes- Gross vision intact, PERRLA, conjunctivae clear secretions            Ears- Hearing, canals-normal            Nose- Clear, no-Septal dev, mucus, polyps, erosion, perforation             Throat- Mallampati II , mucosa clear , drainage- none, tonsils- atrophic Neck- flexible , trachea midline, no stridor , thyroid nl, carotid no bruit Chest - symmetrical excursion , unlabored           Heart/CV- RRR , no murmur , no gallop  , no rub, nl s1 s2                           - JVD- none , edema- none, stasis changes- none, varices- none           Lung- + diminished unlabored, wheeze- none, cough+ light , dullness-none, rub- none           Chest wall-  Abd-  Br/ Gen/ Rectal- Not done, not indicated Extrem- cyanosis- none, clubbing, none, atrophy- none, strength- nl Neuro- grossly intact to observation

## 2018-03-11 ENCOUNTER — Ambulatory Visit (HOSPITAL_COMMUNITY)
Admission: RE | Admit: 2018-03-11 | Discharge: 2018-03-11 | Disposition: A | Discharge status: Home / Self Care | Payer: Medicare Other | Source: Ambulatory Visit | Attending: Internal Medicine | Admitting: Internal Medicine

## 2018-03-11 DIAGNOSIS — Z77090 Contact with and (suspected) exposure to asbestos: Secondary | ICD-10-CM | POA: Insufficient documentation

## 2018-03-11 DIAGNOSIS — J449 Chronic obstructive pulmonary disease, unspecified: Secondary | ICD-10-CM | POA: Diagnosis not present

## 2018-03-11 DIAGNOSIS — R0609 Other forms of dyspnea: Secondary | ICD-10-CM | POA: Diagnosis not present

## 2018-04-14 DIAGNOSIS — E782 Mixed hyperlipidemia: Secondary | ICD-10-CM | POA: Diagnosis not present

## 2018-04-14 DIAGNOSIS — E1165 Type 2 diabetes mellitus with hyperglycemia: Secondary | ICD-10-CM | POA: Diagnosis not present

## 2018-04-14 DIAGNOSIS — N183 Chronic kidney disease, stage 3 (moderate): Secondary | ICD-10-CM | POA: Diagnosis not present

## 2018-04-14 DIAGNOSIS — E119 Type 2 diabetes mellitus without complications: Secondary | ICD-10-CM | POA: Diagnosis not present

## 2018-04-14 DIAGNOSIS — E1122 Type 2 diabetes mellitus with diabetic chronic kidney disease: Secondary | ICD-10-CM | POA: Diagnosis not present

## 2018-05-02 DIAGNOSIS — E1122 Type 2 diabetes mellitus with diabetic chronic kidney disease: Secondary | ICD-10-CM | POA: Diagnosis not present

## 2018-05-02 DIAGNOSIS — E782 Mixed hyperlipidemia: Secondary | ICD-10-CM | POA: Diagnosis not present

## 2018-05-02 DIAGNOSIS — E1165 Type 2 diabetes mellitus with hyperglycemia: Secondary | ICD-10-CM | POA: Diagnosis not present

## 2018-05-02 DIAGNOSIS — E119 Type 2 diabetes mellitus without complications: Secondary | ICD-10-CM | POA: Diagnosis not present

## 2018-05-07 DIAGNOSIS — E1122 Type 2 diabetes mellitus with diabetic chronic kidney disease: Secondary | ICD-10-CM | POA: Diagnosis not present

## 2018-05-07 DIAGNOSIS — D696 Thrombocytopenia, unspecified: Secondary | ICD-10-CM | POA: Diagnosis not present

## 2018-05-07 DIAGNOSIS — R945 Abnormal results of liver function studies: Secondary | ICD-10-CM | POA: Diagnosis not present

## 2018-05-07 DIAGNOSIS — E782 Mixed hyperlipidemia: Secondary | ICD-10-CM | POA: Diagnosis not present

## 2018-05-07 DIAGNOSIS — N183 Chronic kidney disease, stage 3 (moderate): Secondary | ICD-10-CM | POA: Diagnosis not present

## 2018-05-21 DIAGNOSIS — J449 Chronic obstructive pulmonary disease, unspecified: Secondary | ICD-10-CM | POA: Diagnosis not present

## 2018-05-26 DIAGNOSIS — E782 Mixed hyperlipidemia: Secondary | ICD-10-CM | POA: Diagnosis not present

## 2018-05-26 DIAGNOSIS — J449 Chronic obstructive pulmonary disease, unspecified: Secondary | ICD-10-CM | POA: Diagnosis not present

## 2018-05-26 DIAGNOSIS — N183 Chronic kidney disease, stage 3 (moderate): Secondary | ICD-10-CM | POA: Diagnosis not present

## 2018-05-26 DIAGNOSIS — D696 Thrombocytopenia, unspecified: Secondary | ICD-10-CM | POA: Diagnosis not present

## 2018-05-26 DIAGNOSIS — E1122 Type 2 diabetes mellitus with diabetic chronic kidney disease: Secondary | ICD-10-CM | POA: Diagnosis not present

## 2018-05-29 DIAGNOSIS — H401122 Primary open-angle glaucoma, left eye, moderate stage: Secondary | ICD-10-CM | POA: Diagnosis not present

## 2018-05-29 DIAGNOSIS — H401113 Primary open-angle glaucoma, right eye, severe stage: Secondary | ICD-10-CM | POA: Diagnosis not present

## 2018-06-17 DIAGNOSIS — M545 Low back pain: Secondary | ICD-10-CM | POA: Diagnosis not present

## 2018-06-17 DIAGNOSIS — E119 Type 2 diabetes mellitus without complications: Secondary | ICD-10-CM | POA: Diagnosis not present

## 2018-06-17 DIAGNOSIS — J449 Chronic obstructive pulmonary disease, unspecified: Secondary | ICD-10-CM | POA: Diagnosis not present

## 2018-06-17 DIAGNOSIS — E1122 Type 2 diabetes mellitus with diabetic chronic kidney disease: Secondary | ICD-10-CM | POA: Diagnosis not present

## 2018-06-17 DIAGNOSIS — N183 Chronic kidney disease, stage 3 (moderate): Secondary | ICD-10-CM | POA: Diagnosis not present

## 2018-06-17 DIAGNOSIS — E1165 Type 2 diabetes mellitus with hyperglycemia: Secondary | ICD-10-CM | POA: Diagnosis not present

## 2018-06-26 DIAGNOSIS — J449 Chronic obstructive pulmonary disease, unspecified: Secondary | ICD-10-CM | POA: Diagnosis not present

## 2018-06-26 DIAGNOSIS — E782 Mixed hyperlipidemia: Secondary | ICD-10-CM | POA: Diagnosis not present

## 2018-06-26 DIAGNOSIS — N183 Chronic kidney disease, stage 3 (moderate): Secondary | ICD-10-CM | POA: Diagnosis not present

## 2018-06-26 DIAGNOSIS — D696 Thrombocytopenia, unspecified: Secondary | ICD-10-CM | POA: Diagnosis not present

## 2018-06-26 DIAGNOSIS — E1122 Type 2 diabetes mellitus with diabetic chronic kidney disease: Secondary | ICD-10-CM | POA: Diagnosis not present

## 2018-07-04 DIAGNOSIS — D696 Thrombocytopenia, unspecified: Secondary | ICD-10-CM | POA: Diagnosis not present

## 2018-07-04 DIAGNOSIS — E782 Mixed hyperlipidemia: Secondary | ICD-10-CM | POA: Diagnosis not present

## 2018-07-04 DIAGNOSIS — E1122 Type 2 diabetes mellitus with diabetic chronic kidney disease: Secondary | ICD-10-CM | POA: Diagnosis not present

## 2018-07-04 DIAGNOSIS — J449 Chronic obstructive pulmonary disease, unspecified: Secondary | ICD-10-CM | POA: Diagnosis not present

## 2018-07-04 DIAGNOSIS — N183 Chronic kidney disease, stage 3 (moderate): Secondary | ICD-10-CM | POA: Diagnosis not present

## 2018-07-31 DIAGNOSIS — E1165 Type 2 diabetes mellitus with hyperglycemia: Secondary | ICD-10-CM | POA: Diagnosis not present

## 2018-07-31 DIAGNOSIS — N183 Chronic kidney disease, stage 3 (moderate): Secondary | ICD-10-CM | POA: Diagnosis not present

## 2018-07-31 DIAGNOSIS — E1122 Type 2 diabetes mellitus with diabetic chronic kidney disease: Secondary | ICD-10-CM | POA: Diagnosis not present

## 2018-07-31 DIAGNOSIS — E782 Mixed hyperlipidemia: Secondary | ICD-10-CM | POA: Diagnosis not present

## 2018-08-01 DIAGNOSIS — E1122 Type 2 diabetes mellitus with diabetic chronic kidney disease: Secondary | ICD-10-CM | POA: Diagnosis not present

## 2018-08-01 DIAGNOSIS — J449 Chronic obstructive pulmonary disease, unspecified: Secondary | ICD-10-CM | POA: Diagnosis not present

## 2018-08-01 DIAGNOSIS — E782 Mixed hyperlipidemia: Secondary | ICD-10-CM | POA: Diagnosis not present

## 2018-08-01 DIAGNOSIS — N183 Chronic kidney disease, stage 3 (moderate): Secondary | ICD-10-CM | POA: Diagnosis not present

## 2018-08-06 DIAGNOSIS — E782 Mixed hyperlipidemia: Secondary | ICD-10-CM | POA: Diagnosis not present

## 2018-08-06 DIAGNOSIS — E1122 Type 2 diabetes mellitus with diabetic chronic kidney disease: Secondary | ICD-10-CM | POA: Diagnosis not present

## 2018-08-06 DIAGNOSIS — D696 Thrombocytopenia, unspecified: Secondary | ICD-10-CM | POA: Diagnosis not present

## 2018-08-06 DIAGNOSIS — R945 Abnormal results of liver function studies: Secondary | ICD-10-CM | POA: Diagnosis not present

## 2018-08-06 DIAGNOSIS — N183 Chronic kidney disease, stage 3 (moderate): Secondary | ICD-10-CM | POA: Diagnosis not present

## 2018-08-11 DIAGNOSIS — J449 Chronic obstructive pulmonary disease, unspecified: Secondary | ICD-10-CM | POA: Diagnosis not present

## 2018-08-12 DIAGNOSIS — Z0000 Encounter for general adult medical examination without abnormal findings: Secondary | ICD-10-CM | POA: Diagnosis not present

## 2018-09-11 DIAGNOSIS — E119 Type 2 diabetes mellitus without complications: Secondary | ICD-10-CM | POA: Diagnosis not present

## 2018-10-02 DIAGNOSIS — J449 Chronic obstructive pulmonary disease, unspecified: Secondary | ICD-10-CM | POA: Diagnosis not present

## 2018-10-08 DIAGNOSIS — E782 Mixed hyperlipidemia: Secondary | ICD-10-CM | POA: Diagnosis not present

## 2018-10-08 DIAGNOSIS — E1122 Type 2 diabetes mellitus with diabetic chronic kidney disease: Secondary | ICD-10-CM | POA: Diagnosis not present

## 2018-10-08 DIAGNOSIS — J449 Chronic obstructive pulmonary disease, unspecified: Secondary | ICD-10-CM | POA: Diagnosis not present

## 2018-10-08 DIAGNOSIS — D696 Thrombocytopenia, unspecified: Secondary | ICD-10-CM | POA: Diagnosis not present

## 2018-10-08 DIAGNOSIS — N183 Chronic kidney disease, stage 3 (moderate): Secondary | ICD-10-CM | POA: Diagnosis not present

## 2018-10-17 ENCOUNTER — Other Ambulatory Visit: Payer: Self-pay | Admitting: Interventional Cardiology

## 2018-11-06 DIAGNOSIS — E1122 Type 2 diabetes mellitus with diabetic chronic kidney disease: Secondary | ICD-10-CM | POA: Diagnosis not present

## 2018-11-06 DIAGNOSIS — E782 Mixed hyperlipidemia: Secondary | ICD-10-CM | POA: Diagnosis not present

## 2018-11-06 DIAGNOSIS — E1165 Type 2 diabetes mellitus with hyperglycemia: Secondary | ICD-10-CM | POA: Diagnosis not present

## 2018-11-11 DIAGNOSIS — D696 Thrombocytopenia, unspecified: Secondary | ICD-10-CM | POA: Diagnosis not present

## 2018-11-11 DIAGNOSIS — J449 Chronic obstructive pulmonary disease, unspecified: Secondary | ICD-10-CM | POA: Diagnosis not present

## 2018-11-11 DIAGNOSIS — E1122 Type 2 diabetes mellitus with diabetic chronic kidney disease: Secondary | ICD-10-CM | POA: Diagnosis not present

## 2018-11-11 DIAGNOSIS — N183 Chronic kidney disease, stage 3 (moderate): Secondary | ICD-10-CM | POA: Diagnosis not present

## 2018-11-11 DIAGNOSIS — E782 Mixed hyperlipidemia: Secondary | ICD-10-CM | POA: Diagnosis not present

## 2018-11-19 DIAGNOSIS — H401113 Primary open-angle glaucoma, right eye, severe stage: Secondary | ICD-10-CM | POA: Diagnosis not present

## 2018-11-25 DIAGNOSIS — L820 Inflamed seborrheic keratosis: Secondary | ICD-10-CM | POA: Diagnosis not present

## 2018-12-04 DIAGNOSIS — D696 Thrombocytopenia, unspecified: Secondary | ICD-10-CM | POA: Diagnosis not present

## 2018-12-04 DIAGNOSIS — J449 Chronic obstructive pulmonary disease, unspecified: Secondary | ICD-10-CM | POA: Diagnosis not present

## 2018-12-04 DIAGNOSIS — N183 Chronic kidney disease, stage 3 (moderate): Secondary | ICD-10-CM | POA: Diagnosis not present

## 2018-12-04 DIAGNOSIS — E1122 Type 2 diabetes mellitus with diabetic chronic kidney disease: Secondary | ICD-10-CM | POA: Diagnosis not present

## 2018-12-04 DIAGNOSIS — E782 Mixed hyperlipidemia: Secondary | ICD-10-CM | POA: Diagnosis not present

## 2018-12-22 DIAGNOSIS — J449 Chronic obstructive pulmonary disease, unspecified: Secondary | ICD-10-CM | POA: Diagnosis not present

## 2019-02-01 DIAGNOSIS — E782 Mixed hyperlipidemia: Secondary | ICD-10-CM | POA: Diagnosis not present

## 2019-02-01 DIAGNOSIS — J449 Chronic obstructive pulmonary disease, unspecified: Secondary | ICD-10-CM | POA: Diagnosis not present

## 2019-02-01 DIAGNOSIS — E1122 Type 2 diabetes mellitus with diabetic chronic kidney disease: Secondary | ICD-10-CM | POA: Diagnosis not present

## 2019-02-06 DIAGNOSIS — N183 Chronic kidney disease, stage 3 unspecified: Secondary | ICD-10-CM | POA: Diagnosis not present

## 2019-02-06 DIAGNOSIS — E1165 Type 2 diabetes mellitus with hyperglycemia: Secondary | ICD-10-CM | POA: Diagnosis not present

## 2019-02-06 DIAGNOSIS — E1122 Type 2 diabetes mellitus with diabetic chronic kidney disease: Secondary | ICD-10-CM | POA: Diagnosis not present

## 2019-02-06 DIAGNOSIS — E782 Mixed hyperlipidemia: Secondary | ICD-10-CM | POA: Diagnosis not present

## 2019-02-06 DIAGNOSIS — E119 Type 2 diabetes mellitus without complications: Secondary | ICD-10-CM | POA: Diagnosis not present

## 2019-02-09 DIAGNOSIS — E1122 Type 2 diabetes mellitus with diabetic chronic kidney disease: Secondary | ICD-10-CM | POA: Diagnosis not present

## 2019-02-09 DIAGNOSIS — J449 Chronic obstructive pulmonary disease, unspecified: Secondary | ICD-10-CM | POA: Diagnosis not present

## 2019-02-09 DIAGNOSIS — D696 Thrombocytopenia, unspecified: Secondary | ICD-10-CM | POA: Diagnosis not present

## 2019-02-09 DIAGNOSIS — Z0000 Encounter for general adult medical examination without abnormal findings: Secondary | ICD-10-CM | POA: Diagnosis not present

## 2019-02-09 DIAGNOSIS — E782 Mixed hyperlipidemia: Secondary | ICD-10-CM | POA: Diagnosis not present

## 2019-02-18 DIAGNOSIS — G9009 Other idiopathic peripheral autonomic neuropathy: Secondary | ICD-10-CM | POA: Diagnosis not present

## 2019-02-18 DIAGNOSIS — W19XXXA Unspecified fall, initial encounter: Secondary | ICD-10-CM | POA: Diagnosis not present

## 2019-02-23 DIAGNOSIS — J449 Chronic obstructive pulmonary disease, unspecified: Secondary | ICD-10-CM | POA: Diagnosis not present

## 2019-02-23 DIAGNOSIS — H409 Unspecified glaucoma: Secondary | ICD-10-CM | POA: Diagnosis not present

## 2019-02-23 DIAGNOSIS — E1142 Type 2 diabetes mellitus with diabetic polyneuropathy: Secondary | ICD-10-CM | POA: Diagnosis not present

## 2019-02-23 DIAGNOSIS — Z9181 History of falling: Secondary | ICD-10-CM | POA: Diagnosis not present

## 2019-02-23 DIAGNOSIS — Z794 Long term (current) use of insulin: Secondary | ICD-10-CM | POA: Diagnosis not present

## 2019-02-23 DIAGNOSIS — Z7951 Long term (current) use of inhaled steroids: Secondary | ICD-10-CM | POA: Diagnosis not present

## 2019-02-25 DIAGNOSIS — E1142 Type 2 diabetes mellitus with diabetic polyneuropathy: Secondary | ICD-10-CM | POA: Diagnosis not present

## 2019-02-25 DIAGNOSIS — Z9181 History of falling: Secondary | ICD-10-CM | POA: Diagnosis not present

## 2019-02-25 DIAGNOSIS — Z794 Long term (current) use of insulin: Secondary | ICD-10-CM | POA: Diagnosis not present

## 2019-02-25 DIAGNOSIS — J449 Chronic obstructive pulmonary disease, unspecified: Secondary | ICD-10-CM | POA: Diagnosis not present

## 2019-02-25 DIAGNOSIS — H409 Unspecified glaucoma: Secondary | ICD-10-CM | POA: Diagnosis not present

## 2019-02-25 DIAGNOSIS — Z7951 Long term (current) use of inhaled steroids: Secondary | ICD-10-CM | POA: Diagnosis not present

## 2019-03-03 DIAGNOSIS — Z7951 Long term (current) use of inhaled steroids: Secondary | ICD-10-CM | POA: Diagnosis not present

## 2019-03-03 DIAGNOSIS — Z794 Long term (current) use of insulin: Secondary | ICD-10-CM | POA: Diagnosis not present

## 2019-03-03 DIAGNOSIS — J449 Chronic obstructive pulmonary disease, unspecified: Secondary | ICD-10-CM | POA: Diagnosis not present

## 2019-03-03 DIAGNOSIS — H409 Unspecified glaucoma: Secondary | ICD-10-CM | POA: Diagnosis not present

## 2019-03-03 DIAGNOSIS — E1142 Type 2 diabetes mellitus with diabetic polyneuropathy: Secondary | ICD-10-CM | POA: Diagnosis not present

## 2019-03-03 DIAGNOSIS — Z9181 History of falling: Secondary | ICD-10-CM | POA: Diagnosis not present

## 2019-03-04 DIAGNOSIS — G9009 Other idiopathic peripheral autonomic neuropathy: Secondary | ICD-10-CM | POA: Diagnosis not present

## 2019-03-04 DIAGNOSIS — W19XXXD Unspecified fall, subsequent encounter: Secondary | ICD-10-CM | POA: Diagnosis not present

## 2019-03-04 DIAGNOSIS — G3184 Mild cognitive impairment, so stated: Secondary | ICD-10-CM | POA: Diagnosis not present

## 2019-03-05 DIAGNOSIS — Z794 Long term (current) use of insulin: Secondary | ICD-10-CM | POA: Diagnosis not present

## 2019-03-05 DIAGNOSIS — Z9181 History of falling: Secondary | ICD-10-CM | POA: Diagnosis not present

## 2019-03-05 DIAGNOSIS — H409 Unspecified glaucoma: Secondary | ICD-10-CM | POA: Diagnosis not present

## 2019-03-05 DIAGNOSIS — Z7951 Long term (current) use of inhaled steroids: Secondary | ICD-10-CM | POA: Diagnosis not present

## 2019-03-05 DIAGNOSIS — J449 Chronic obstructive pulmonary disease, unspecified: Secondary | ICD-10-CM | POA: Diagnosis not present

## 2019-03-05 DIAGNOSIS — E1142 Type 2 diabetes mellitus with diabetic polyneuropathy: Secondary | ICD-10-CM | POA: Diagnosis not present

## 2019-03-09 DIAGNOSIS — Z7951 Long term (current) use of inhaled steroids: Secondary | ICD-10-CM | POA: Diagnosis not present

## 2019-03-09 DIAGNOSIS — J449 Chronic obstructive pulmonary disease, unspecified: Secondary | ICD-10-CM | POA: Diagnosis not present

## 2019-03-09 DIAGNOSIS — Z9181 History of falling: Secondary | ICD-10-CM | POA: Diagnosis not present

## 2019-03-09 DIAGNOSIS — H409 Unspecified glaucoma: Secondary | ICD-10-CM | POA: Diagnosis not present

## 2019-03-09 DIAGNOSIS — E1142 Type 2 diabetes mellitus with diabetic polyneuropathy: Secondary | ICD-10-CM | POA: Diagnosis not present

## 2019-03-09 DIAGNOSIS — Z794 Long term (current) use of insulin: Secondary | ICD-10-CM | POA: Diagnosis not present

## 2019-03-10 DIAGNOSIS — Z794 Long term (current) use of insulin: Secondary | ICD-10-CM | POA: Diagnosis not present

## 2019-03-10 DIAGNOSIS — J449 Chronic obstructive pulmonary disease, unspecified: Secondary | ICD-10-CM | POA: Diagnosis not present

## 2019-03-10 DIAGNOSIS — H409 Unspecified glaucoma: Secondary | ICD-10-CM | POA: Diagnosis not present

## 2019-03-10 DIAGNOSIS — E1142 Type 2 diabetes mellitus with diabetic polyneuropathy: Secondary | ICD-10-CM | POA: Diagnosis not present

## 2019-03-10 DIAGNOSIS — Z7951 Long term (current) use of inhaled steroids: Secondary | ICD-10-CM | POA: Diagnosis not present

## 2019-03-10 DIAGNOSIS — Z9181 History of falling: Secondary | ICD-10-CM | POA: Diagnosis not present

## 2019-03-11 ENCOUNTER — Ambulatory Visit (INDEPENDENT_AMBULATORY_CARE_PROVIDER_SITE_OTHER): Payer: Medicare Other | Admitting: Internal Medicine

## 2019-03-11 ENCOUNTER — Encounter: Payer: Self-pay | Admitting: Internal Medicine

## 2019-03-11 DIAGNOSIS — J449 Chronic obstructive pulmonary disease, unspecified: Secondary | ICD-10-CM

## 2019-03-11 DIAGNOSIS — M549 Dorsalgia, unspecified: Secondary | ICD-10-CM | POA: Insufficient documentation

## 2019-03-11 DIAGNOSIS — Z77090 Contact with and (suspected) exposure to asbestos: Secondary | ICD-10-CM | POA: Diagnosis not present

## 2019-03-11 DIAGNOSIS — M546 Pain in thoracic spine: Secondary | ICD-10-CM | POA: Diagnosis not present

## 2019-03-11 MED ORDER — BUDESONIDE-FORMOTEROL FUMARATE 160-4.5 MCG/ACT IN AERO: 2.0000 | INHALATION_SPRAY | Freq: Two times a day (BID) | RESPIRATORY_TRACT | 12 refills | Status: DC

## 2019-03-11 MED ORDER — PROAIR HFA 108 (90 BASE) MCG/ACT IN AERS: 1.0000 | INHALATION_SPRAY | Freq: Four times a day (QID) | RESPIRATORY_TRACT | 12 refills | Status: AC | PRN

## 2019-03-11 NOTE — Patient Instructions (Signed)
Scripts sent refilling Symbicort and Proair inhalers  Please call if we can help

## 2019-03-11 NOTE — Assessment & Plan Note (Signed)
Continuing CXR surveillance

## 2019-03-11 NOTE — Assessment & Plan Note (Signed)
By description sounds like tussive mscwp, possibly cracked rib. Plan- treat with heat, ibuprofen and call if not improving over next 1-2 weeks. Anticipate CXR next ov, but sooner if nec.

## 2019-03-11 NOTE — Progress Notes (Signed)
HPI  M former smoker followed for COPD with history of asbestos exposure, complicated by CAD, DM, GERD  -------------------------------------------------------------------------------------------  03/10/2018- 85 year old male former smoker followed for COPD, history asbestos exposure, complicated by CAD, DM 2, GERD, glaucoma Here with wife -----pt c/o stable sob and wheezing with exertion, prod cough with clear mucus.   He describes a sustained bronchitis lasting 3 weeks after flu shot in October, now resolved.  Some dyspnea on exertion without acute event.  No exertional chest pain, discolored sputum, adenopathy or fever now. CXR 03/07/2017- IMPRESSION: Bilateral pleural plaques.  No active disease.  03/11/2019- Virtual Visit via Telephone Note  I connected with [name] on 03/11/19 at 10:30 AM EST by telephone and verified that I am speaking with the correct person using two identifiers.  Location: Patient: H Provider: O   I discussed the limitations, risks, security and privacy concerns of performing an evaluation and management service by telephone and the availability of in person appointments. I also discussed with the patient that there may be a patient responsible charge related to this service. The patient expressed understanding and agreed to proceed.   History of Present Illness: 85 year old male former smoker followed for COPD, history asbestos exposure, complicated by CAD, DM 2, GERD, glaucoma Proair hfa, Symbicort 160, neb albuterol Says meds are fine. Wife interjects that he needs refills. Not using rescue inhaler much now, feels stable. Sneezed triggering sharp R back pain 3 weeks ago. Treating heat and ibuprofen- some better. Hurts to bend, twist or sneeze. To call us if it doesn't gradually improve.  Had flu vax.   Observations/Objective: CXR-03/11/18 No active cardiopulmonary disease. Stable calcified bilateral pleural plaques.  Assessment and  Plan: COPD mixed type- to continue meds Asbestos plaques- f/u CXR at next visit [facility]- likley cracked rib. To let us know if it doesn't gradually improve  Follow Up Instructions: 6 months   I discussed the assessment and treatment plan with the patient. The patient was provided an opportunity to ask questions and all were answered. The patient agreed with the plan and demonstrated an understanding of the instructions.   The patient was advised to call back or seek an in-person evaluation if the symptoms worsen or if the condition fails to improve as anticipated.  I provided 18 minutes of non-face-to-face time during this encounter.   Baird Lyons, MD    ROS, see HPI + = positive Constitutional:   No-   weight loss, night sweats, fevers, chills, fatigue, lassitude. HEENT:   No-  headaches, difficulty swallowing, tooth/dental problems, sore throat,       No-  sneezing, itching, ear ache, nasal congestion, post nasal drip,  CV:  No-   chest pain, orthopnea, PND, swelling in lower extremities, anasarca, dizziness, palpitations Resp: + Shortness of breath with exertion or at rest.             + Productive cough,  No non-productive cough,  No- coughing up of blood.              No-   change in color of mucus.  No- wheezing.   Skin: No-   rash or lesions. GI:  No-   heartburn, indigestion, abdominal pain, nausea, vomiting,  GU:  MS:  No-   joint pain or swelling.   Neuro-     nothing unusual Psych:  No- change in mood or affect. No depression or anxiety.  No memory loss.  OBJ General- Alert, Oriented, Affect-appropriate, Distress- none  acute Skin- rash-none, lesions- none, excoriation- none Lymphadenopathy- none Head- atraumatic            Eyes- Gross vision intact, PERRLA, conjunctivae clear secretions            Ears- Hearing, canals-normal            Nose- Clear, no-Septal dev, mucus, polyps, erosion, perforation             Throat- Mallampati II , mucosa clear , drainage-  none, tonsils- atrophic Neck- flexible , trachea midline, no stridor , thyroid nl, carotid no bruit Chest - symmetrical excursion , unlabored           Heart/CV- RRR , no murmur , no gallop  , no rub, nl s1 s2                           - JVD- none , edema- none, stasis changes- none, varices- none           Lung- + diminished unlabored, wheeze- none, cough+ light , dullness-none, rub- none           Chest wall-  Abd-  Br/ Gen/ Rectal- Not done, not indicated Extrem- cyanosis- none, clubbing, none, atrophy- none, strength- nl Neuro- grossly intact to observation

## 2019-03-11 NOTE — Assessment & Plan Note (Signed)
No exacerbation. Comfortable with current meds Plan- refills

## 2019-03-16 DIAGNOSIS — E1142 Type 2 diabetes mellitus with diabetic polyneuropathy: Secondary | ICD-10-CM | POA: Diagnosis not present

## 2019-03-16 DIAGNOSIS — Z794 Long term (current) use of insulin: Secondary | ICD-10-CM | POA: Diagnosis not present

## 2019-03-16 DIAGNOSIS — Z7951 Long term (current) use of inhaled steroids: Secondary | ICD-10-CM | POA: Diagnosis not present

## 2019-03-16 DIAGNOSIS — H409 Unspecified glaucoma: Secondary | ICD-10-CM | POA: Diagnosis not present

## 2019-03-16 DIAGNOSIS — J449 Chronic obstructive pulmonary disease, unspecified: Secondary | ICD-10-CM | POA: Diagnosis not present

## 2019-03-16 DIAGNOSIS — Z9181 History of falling: Secondary | ICD-10-CM | POA: Diagnosis not present

## 2019-03-19 DIAGNOSIS — Z7951 Long term (current) use of inhaled steroids: Secondary | ICD-10-CM | POA: Diagnosis not present

## 2019-03-19 DIAGNOSIS — H409 Unspecified glaucoma: Secondary | ICD-10-CM | POA: Diagnosis not present

## 2019-03-19 DIAGNOSIS — J449 Chronic obstructive pulmonary disease, unspecified: Secondary | ICD-10-CM | POA: Diagnosis not present

## 2019-03-19 DIAGNOSIS — Z9181 History of falling: Secondary | ICD-10-CM | POA: Diagnosis not present

## 2019-03-19 DIAGNOSIS — E1142 Type 2 diabetes mellitus with diabetic polyneuropathy: Secondary | ICD-10-CM | POA: Diagnosis not present

## 2019-03-19 DIAGNOSIS — Z794 Long term (current) use of insulin: Secondary | ICD-10-CM | POA: Diagnosis not present

## 2019-04-07 DIAGNOSIS — N183 Chronic kidney disease, stage 3 unspecified: Secondary | ICD-10-CM | POA: Diagnosis not present

## 2019-04-07 DIAGNOSIS — D696 Thrombocytopenia, unspecified: Secondary | ICD-10-CM | POA: Diagnosis not present

## 2019-04-07 DIAGNOSIS — E1122 Type 2 diabetes mellitus with diabetic chronic kidney disease: Secondary | ICD-10-CM | POA: Diagnosis not present

## 2019-04-07 DIAGNOSIS — E782 Mixed hyperlipidemia: Secondary | ICD-10-CM | POA: Diagnosis not present

## 2019-04-07 DIAGNOSIS — J449 Chronic obstructive pulmonary disease, unspecified: Secondary | ICD-10-CM | POA: Diagnosis not present

## 2019-04-15 DIAGNOSIS — J019 Acute sinusitis, unspecified: Secondary | ICD-10-CM | POA: Diagnosis not present

## 2019-05-07 ENCOUNTER — Other Ambulatory Visit: Payer: Self-pay | Admitting: Internal Medicine

## 2019-05-07 DIAGNOSIS — J449 Chronic obstructive pulmonary disease, unspecified: Secondary | ICD-10-CM | POA: Diagnosis not present

## 2019-05-12 DIAGNOSIS — E1122 Type 2 diabetes mellitus with diabetic chronic kidney disease: Secondary | ICD-10-CM | POA: Diagnosis not present

## 2019-05-12 DIAGNOSIS — E1142 Type 2 diabetes mellitus with diabetic polyneuropathy: Secondary | ICD-10-CM | POA: Diagnosis not present

## 2019-05-12 DIAGNOSIS — E1165 Type 2 diabetes mellitus with hyperglycemia: Secondary | ICD-10-CM | POA: Diagnosis not present

## 2019-05-12 DIAGNOSIS — E782 Mixed hyperlipidemia: Secondary | ICD-10-CM | POA: Diagnosis not present

## 2019-05-18 DIAGNOSIS — R945 Abnormal results of liver function studies: Secondary | ICD-10-CM | POA: Diagnosis not present

## 2019-05-18 DIAGNOSIS — J449 Chronic obstructive pulmonary disease, unspecified: Secondary | ICD-10-CM | POA: Diagnosis not present

## 2019-05-18 DIAGNOSIS — D696 Thrombocytopenia, unspecified: Secondary | ICD-10-CM | POA: Diagnosis not present

## 2019-05-18 DIAGNOSIS — E1122 Type 2 diabetes mellitus with diabetic chronic kidney disease: Secondary | ICD-10-CM | POA: Diagnosis not present

## 2019-05-18 DIAGNOSIS — E782 Mixed hyperlipidemia: Secondary | ICD-10-CM | POA: Diagnosis not present

## 2019-05-20 ENCOUNTER — Other Ambulatory Visit: Payer: Self-pay | Admitting: Internal Medicine

## 2019-05-23 ENCOUNTER — Other Ambulatory Visit (HOSPITAL_COMMUNITY): Payer: Self-pay | Admitting: Internal Medicine

## 2019-05-23 ENCOUNTER — Ambulatory Visit (HOSPITAL_COMMUNITY)
Admission: RE | Admit: 2019-05-23 | Discharge: 2019-05-23 | Disposition: A | Discharge status: Home / Self Care | Payer: Medicare Other | Source: Ambulatory Visit | Attending: Internal Medicine | Admitting: Internal Medicine

## 2019-05-23 ENCOUNTER — Other Ambulatory Visit: Payer: Self-pay

## 2019-05-23 DIAGNOSIS — T148XXA Other injury of unspecified body region, initial encounter: Secondary | ICD-10-CM | POA: Diagnosis not present

## 2019-05-23 DIAGNOSIS — R0602 Shortness of breath: Secondary | ICD-10-CM | POA: Diagnosis not present

## 2019-05-23 DIAGNOSIS — M25519 Pain in unspecified shoulder: Secondary | ICD-10-CM | POA: Diagnosis not present

## 2019-05-23 DIAGNOSIS — J948 Other specified pleural conditions: Secondary | ICD-10-CM | POA: Insufficient documentation

## 2019-05-23 DIAGNOSIS — S20219A Contusion of unspecified front wall of thorax, initial encounter: Secondary | ICD-10-CM | POA: Diagnosis not present

## 2019-05-23 DIAGNOSIS — S299XXA Unspecified injury of thorax, initial encounter: Secondary | ICD-10-CM | POA: Diagnosis not present

## 2019-05-23 DIAGNOSIS — R0789 Other chest pain: Secondary | ICD-10-CM

## 2019-05-23 DIAGNOSIS — Y939 Activity, unspecified: Secondary | ICD-10-CM | POA: Insufficient documentation

## 2019-05-23 DIAGNOSIS — I700 Atherosclerosis of aorta: Secondary | ICD-10-CM | POA: Insufficient documentation

## 2019-05-23 DIAGNOSIS — Z77090 Contact with and (suspected) exposure to asbestos: Secondary | ICD-10-CM | POA: Insufficient documentation

## 2019-05-23 DIAGNOSIS — R0781 Pleurodynia: Secondary | ICD-10-CM | POA: Insufficient documentation

## 2019-05-23 DIAGNOSIS — W19XXXA Unspecified fall, initial encounter: Secondary | ICD-10-CM | POA: Diagnosis not present

## 2019-05-23 DIAGNOSIS — Y92009 Unspecified place in unspecified non-institutional (private) residence as the place of occurrence of the external cause: Secondary | ICD-10-CM | POA: Insufficient documentation

## 2019-05-25 ENCOUNTER — Telehealth: Payer: Self-pay | Admitting: General Surgery

## 2019-05-25 MED ORDER — ALBUTEROL SULFATE HFA 108 (90 BASE) MCG/ACT IN AERS: 2.0000 | INHALATION_SPRAY | Freq: Four times a day (QID) | RESPIRATORY_TRACT | 5 refills | Status: AC | PRN

## 2019-05-25 NOTE — Telephone Encounter (Signed)
Ok to replace his Proair hfa with albuterol hfa, # 1, ref x 12    inhale 2 puffs every 6 hours as needed

## 2019-05-25 NOTE — Telephone Encounter (Signed)
/  [name],   This patient was last seen by you on 03/11/19 for Asbestos exposure, COPD mixed type.  An electronic request for the generic Proair inhaler was received.  The patient was issued a prescription for Proair HFA on 03/11/19 with 12 refills.  Below is a cut/paste of the office note:  "History of Present Illness: 86 year old male former smoker followed for COPD, history asbestos exposure, complicated by CAD, DM 2, GERD, glaucoma Proair hfa, Symbicort 160, neb albuterol Says meds are fine. Wife interjects that he needs refills. Not using rescue inhaler much now, feels stable. Sneezed triggering sharp R back pain 3 weeks ago. Treating heat and ibuprofen- some better. Hurts to bend, twist or sneeze. To call us if it doesn't gradually improve.  Had flu vax.  Observations/Objective: CXR-03/11/18 No active cardiopulmonary disease. Stable calcified bilateral pleural plaques.  Assessment and Plan: COPD mixed type- to continue meds Asbestos plaques- f/u CXR at next visit [facility]- likley cracked rib. To let us know if it doesn't gradually improve  Follow Up Instructions: 6 months  I discussed the assessment and treatment plan with the patient. The patient was provided an opportunity to ask questions and all were answered. The patient agreed with the plan and demonstrated an understanding of the instructions.  The patient was advised to call back or seek an in-person evaluation if the symptoms worsen or if the condition fails to improve as anticipated.  No exacerbation. Comfortable with current meds Plan- refills  Continuing CXR surveillance  Scripts sent refilling Symbicort and Proair inhalers  Please call if we can help"  Based on the information above, can a generic be sent in for this patient? Thank you.

## 2019-05-25 NOTE — Telephone Encounter (Signed)
rx for albuterol hfa refilled.

## 2019-06-15 ENCOUNTER — Other Ambulatory Visit: Payer: Self-pay | Admitting: Internal Medicine

## 2019-06-22 ENCOUNTER — Emergency Department (HOSPITAL_COMMUNITY): Payer: Medicare Other

## 2019-06-22 ENCOUNTER — Emergency Department (HOSPITAL_COMMUNITY)
Admission: EM | Admit: 2019-06-22 | Discharge: 2019-06-23 | Disposition: A | Discharge status: Home / Self Care | Payer: Medicare Other | Attending: Emergency Medicine | Admitting: Emergency Medicine

## 2019-06-22 ENCOUNTER — Other Ambulatory Visit: Payer: Self-pay

## 2019-06-22 ENCOUNTER — Encounter (HOSPITAL_COMMUNITY): Payer: Self-pay

## 2019-06-22 DIAGNOSIS — N183 Chronic kidney disease, stage 3 unspecified: Secondary | ICD-10-CM | POA: Insufficient documentation

## 2019-06-22 DIAGNOSIS — I1310 Hypertensive heart and chronic kidney disease without heart failure, with stage 1 through stage 4 chronic kidney disease, or unspecified chronic kidney disease: Secondary | ICD-10-CM | POA: Diagnosis not present

## 2019-06-22 DIAGNOSIS — Z79899 Other long term (current) drug therapy: Secondary | ICD-10-CM | POA: Diagnosis not present

## 2019-06-22 DIAGNOSIS — R0602 Shortness of breath: Secondary | ICD-10-CM

## 2019-06-22 DIAGNOSIS — Z87891 Personal history of nicotine dependence: Secondary | ICD-10-CM | POA: Diagnosis not present

## 2019-06-22 DIAGNOSIS — J449 Chronic obstructive pulmonary disease, unspecified: Secondary | ICD-10-CM | POA: Insufficient documentation

## 2019-06-22 DIAGNOSIS — J441 Chronic obstructive pulmonary disease with (acute) exacerbation: Secondary | ICD-10-CM | POA: Diagnosis not present

## 2019-06-22 DIAGNOSIS — R0609 Other forms of dyspnea: Secondary | ICD-10-CM | POA: Insufficient documentation

## 2019-06-22 DIAGNOSIS — E119 Type 2 diabetes mellitus without complications: Secondary | ICD-10-CM | POA: Diagnosis not present

## 2019-06-22 DIAGNOSIS — I2510 Atherosclerotic heart disease of native coronary artery without angina pectoris: Secondary | ICD-10-CM | POA: Diagnosis not present

## 2019-06-22 DIAGNOSIS — R0600 Dyspnea, unspecified: Secondary | ICD-10-CM

## 2019-06-22 NOTE — ED Provider Notes (Signed)
[facility] EMERGENCY DEPARTMENT Provider Note   CSN: [id number] Arrival date & time: 06/22/19  2033     History Chief Complaint  Patient presents with  . Shortness of Breath    Stephen [name]. is a 86 y.o. male with history of coronary artery disease, CKD, COPD, diabetes mellitus, GERD, hypertension, hyperlipidemia presents for evaluation of acute onset, progressively worsening shortness of breath for 1 month.  He reports that he had a fall about a month ago which was unwitnessed for which a few days later he was seen by his PCP with negative radiographs.  He does note that since then he has had persistent shortness of breath which worsens with any sort of exertion.  Earlier today he felt as though he could not get a deep breath and so this prompted evaluation in the ED.  He denies any chest pain.  He has a chronic cough which he reports is baseline for him.  He denies any fevers, chills, abdominal pain.  He has had some nausea but no vomiting.  He has been using his Symbicort as well as his rescue inhalers and breathing treatments consistently daily with mild temporary relief.  He is a former smoker.  No known sick contacts, no concern for Covid.  The history is provided by the patient and the spouse.       Past Medical History:  Diagnosis Date  . Abnormal chest x-ray   . Asbestos exposure   . CAD (coronary artery disease)   . CKD (chronic kidney disease), stage III   . COPD (chronic obstructive pulmonary disease) ([place])   . DM (diabetes mellitus) ([place])   . GERD (gastroesophageal reflux disease)   . Hyperlipidemia   . Hypertension     Patient Active Problem List   Diagnosis Date Noted  . Back pain 03/11/2019  . Nonallergic vasomotor rhinitis 02/01/2016  . DYSPNEA 02/02/2008  . Coronary atherosclerosis 01/25/2008  . COPD mixed type ([place]) 01/25/2008  . Diabetes ([place]) 01/15/2007  . GERD 01/15/2007  . Asbestos exposure 01/15/2007    Past Surgical History:  Procedure  Laterality Date  . CARDIAC CATHETERIZATION    . CARDIAC CATHETERIZATION    . CATARACT EXTRACTION W/PHACO Left 09/21/2013   Procedure: CATARACT EXTRACTION PHACO AND INTRAOCULAR LENS PLACEMENT (IOC);  Surgeon: Tonny Branch, MD;  Location: [facility];  Service: Ophthalmology;  Laterality: Left;  CDE: 7.34  . CATARACTS    . CHOLECYSTECTOMY    . INGUINAL HERNIA REPAIR         Family History  Problem Relation Age of Onset  . Heart disease Father   . Lung cancer Father        DIED OF LUNG CANCER  . Diabetes type I Brother     Social History   Tobacco Use  . Smoking status: Former Smoker    Packs/day: 2.00    Years: 50.00    Pack years: 100.00    Quit date: 04/02/2001    Years since quitting: 18.2  . Smokeless tobacco: Never Used  Substance Use Topics  . Alcohol use: Not Currently  . Drug use: No    Home Medications Prior to Admission medications   Medication Sig Start Date End Date Taking? Authorizing Provider  albuterol (PROVENTIL) (2.5 MG/3ML) 0.083% nebulizer solution USE 1 VIAL IN NEBULIZER EVERY 6 HOURS AS NEEDED. 06/16/19   Baird Lyons D, MD  albuterol (VENTOLIN HFA) 108 (90 Base) MCG/ACT inhaler Inhale 2 puffs into the lungs every 6 (six) hours  as needed for wheezing or shortness of breath. 05/25/19   Baird Lyons D, MD  aspirin EC 81 MG tablet Take 81 mg by mouth every Monday, Wednesday, and Friday.    [provider]  budesonide-formoterol (SYMBICORT) 160-4.5 MCG/ACT inhaler Inhale 2 puffs into the lungs 2 (two) times daily. RINSE MOUTH AFTER USE 03/11/19   Baird Lyons D, MD  gabapentin (NEURONTIN) 100 MG capsule Take 100 mg by mouth at bedtime. 02/18/19   [provider]  insulin lispro (HUMALOG) 100 UNIT/ML injection Inject 22 Units into the skin daily.    [provider]  isosorbide mononitrate (IMDUR) 60 MG 24 hr tablet TAKE ONE TABLET BY MOUTH DAILY. Patient not taking: Reported on 03/11/2019 10/17/18   Belva Crome, MD  latanoprost  (XALATAN) 0.005 % ophthalmic solution Place 1 drop into both eyes At bedtime. 12/31/11   [provider]  ONE TOUCH ULTRA TEST test strip Use as directed daily. 06/15/15   [provider]  Jonetta Speak LANCETS [id number] MISC Use as directed daily. 06/15/15   [provider]  PROAIR HFA 108 (90 Base) MCG/ACT inhaler Inhale 1-2 puffs into the lungs every 6 (six) hours as needed for wheezing or shortness of breath. 03/11/19   Baird Lyons D, MD  rosuvastatin (CRESTOR) 5 MG tablet Take 5 mg by mouth daily at 6 PM.  10/21/17   [provider]  TRESIBA FLEXTOUCH 200 UNIT/ML SOPN Inject 32 Units as directed every morning.  09/10/17   [provider]    Allergies    Codeine and Morphine  Review of Systems   Review of Systems  Constitutional: Positive for fatigue. Negative for chills and fever.  Respiratory: Positive for cough and shortness of breath.   Cardiovascular: Positive for leg swelling. Negative for chest pain.  Gastrointestinal: Positive for nausea. Negative for abdominal pain and vomiting.  All other systems reviewed and are negative.   Physical Exam Updated Vital Signs BP 125/70   Pulse 89   Temp 98.2 F (36.8 C) (Oral)   Resp (!) 29   Ht 5\' 6"  (1.676 m)   Wt 74.8 kg   SpO2 96%   BMI 26.63 kg/m   Physical Exam Vitals and nursing note reviewed.  Constitutional:      General: He is not in acute distress.    Appearance: He is well-developed.  HENT:     Head: Normocephalic and atraumatic.  Eyes:     General:        Right eye: No discharge.        Left eye: No discharge.     Conjunctiva/sclera: Conjunctivae normal.  Neck:     Vascular: No JVD.     Trachea: No tracheal deviation.  Cardiovascular:     Rate and Rhythm: Normal rate and regular rhythm.     Pulses: Normal pulses.     Comments: 2+ radial and DP/PT pulses bilaterally.  1+ pitting edema of the bilateral lower extremities.  Compartments are soft, Homans' sign absent  bilaterally Pulmonary:     Effort: Tachypnea present.     Comments: Speaking in shorter phrases. SPO2 saturations 100% On 2 L supplemental oxygen for comfort; previously 96% on room air.  Globally diminished breath sounds Chest:     Chest wall: No tenderness.  Abdominal:     General: Bowel sounds are normal. There is no distension.     Palpations: Abdomen is soft.     Tenderness: There is no abdominal tenderness.  Musculoskeletal:  Right lower leg: Edema present.     Left lower leg: Edema present.  Skin:    Findings: No erythema.  Neurological:     Mental Status: He is alert.  Psychiatric:        Behavior: Behavior normal.     ED Results / Procedures / Treatments   Labs (all labs ordered are listed, but only abnormal results are displayed) Labs Reviewed  CBC WITH DIFFERENTIAL/PLATELET  BASIC METABOLIC PANEL  BRAIN NATRIURETIC PEPTIDE  TROPONIN I (HIGH SENSITIVITY)  TROPONIN I (HIGH SENSITIVITY)    EKG EKG Interpretation  Date/Time:  Monday June 22 2019 22:17:30 EDT Ventricular Rate:  85 PR Interval:    QRS Duration: 93 QT Interval:  391 QTC Calculation: 465 R Axis:   74 Text Interpretation: Sinus rhythm Ventricular premature complex Otherwise normal ecg Confirmed by Veryl Speak [phone]) on 06/22/2019 10:37:10 PM   Radiology DG Chest 2 View  Result Date: 06/22/2019 CLINICAL DATA:  Shortness of breath EXAM: CHEST - 2 VIEW COMPARISON:  05/23/2019 FINDINGS: Bilateral calcified pleural plaques. Heart is normal size. No confluent opacities or effusions. No acute bony abnormality. IMPRESSION: No acute cardiopulmonary disease.  Bilateral pleural plaques. Electronically Signed   By: Rolm Baptise M.D.   On: 06/22/2019 23:18    Procedures Procedures (including critical care time)  Medications Ordered in ED Medications  magnesium sulfate IVPB 2 g 50 mL (2 g Intravenous New Bag/Given 06/23/19 0026)  methylPREDNISolone sodium succinate (SOLU-MEDROL) 125 mg/2 mL  injection 125 mg (125 mg Intravenous Given 06/23/19 0024)    ED Course  I have reviewed the triage vital signs and the nursing notes.  Pertinent labs & imaging results that were available during my care of the patient were reviewed by me and considered in my medical decision making (see chart for details).    MDM Rules/Calculators/A&P                      [name] was evaluated in Emergency Department on 06/23/2019 for the symptoms described in the history of present illness. He was evaluated in the context of the global COVID-19 pandemic, which necessitated consideration that the patient might be at risk for infection with the SARS-CoV-2 virus that causes COVID-19. Institutional protocols and algorithms that pertain to the evaluation of patients at risk for COVID-19 are in a state of rapid change based on information released by regulatory bodies including the CDC and federal and state organizations. These policies and algorithms were followed during the patient's care in the ED.  Patient presenting for evaluation of shortness of breath for 1 month, progressively worsening.  Symptoms began after an unwitnessed mechanical fall.  He did have radiographs at his PCPs office a few days after the fall which were negative.  He has no chest wall pain.  He denies chest pain in general.  He is afebrile, tachypneic in the ED with stable SPO2 saturations on room air but was placed on supplemental oxygen for comfort.  He was ambulated in the ED with stable SPO2 saturations.  We will obtain chest x-ray, blood work and EKG and reassess.  1:07 AM CBC shows no leukocytosis, no anemia.  Chest x-ray shows no acute cardiopulmonary abnormalities but has bilateral pleural plaques.  EKG shows normal sinus rhythm with occasional PVCs, no acute ischemic abnormalities.  Patient pending BMP, BNP, troponin.  Currently doubt PE, MI, cardiac tamponade, esophageal rupture, pneumonia or pneumothorax  If work-up is  reassuring patient will  likely be stable for discharge home with treatment for possible COPD exacerbation and close PCP follow-up.  Otherwise may require admission to the hospital.   Final Clinical Impression(s) / ED Diagnoses Final diagnoses:  Shortness of breath  DOE (dyspnea on exertion)    Rx / DC Orders ED Discharge Orders    None       Renita Papa, PA-C 06/23/19 0108    Orpah Greek, MD 06/23/19 [phone]

## 2019-06-22 NOTE — ED Triage Notes (Signed)
Pt to er, pt states that he fell about a month ago, states that he is here today for some shortness of breath, states that he has been sob ever since the fall.  Pt talking in short sentences.  States that he is more sob when he walks around.  States that he also has some chest tightness.

## 2019-06-22 NOTE — ED Notes (Signed)
Pt stayed 98% on room air while ambulating, however pt did state it felt more sob.

## 2019-06-23 LAB — CBC WITH DIFFERENTIAL/PLATELET
Abs Immature Granulocytes: 0.02 10*3/uL (ref 0.00–0.07)
Basophils Absolute: 0 10*3/uL (ref 0.0–0.1)
Basophils Relative: 0 %
Eosinophils Absolute: 0.1 10*3/uL (ref 0.0–0.5)
Eosinophils Relative: 1 %
HCT: 41.3 % (ref 39.0–52.0)
Hemoglobin: 13.5 g/dL (ref 13.0–17.0)
Immature Granulocytes: 0 %
Lymphocytes Relative: 15 %
Lymphs Abs: 0.9 10*3/uL (ref 0.7–4.0)
MCH: 30.3 pg (ref 26.0–34.0)
MCHC: 32.7 g/dL (ref 30.0–36.0)
MCV: 92.6 fL (ref 80.0–100.0)
Monocytes Absolute: 0.5 10*3/uL (ref 0.1–1.0)
Monocytes Relative: 8 %
Neutro Abs: 4.6 10*3/uL (ref 1.7–7.7)
Neutrophils Relative %: 76 %
Platelets: 151 10*3/uL (ref 150–400)
RBC: 4.46 MIL/uL (ref 4.22–5.81)
RDW: 13.2 % (ref 11.5–15.5)
WBC: 6 10*3/uL (ref 4.0–10.5)
nRBC: 0 % (ref 0.0–0.2)

## 2019-06-23 LAB — BASIC METABOLIC PANEL
Anion gap: 9 (ref 5–15)
BUN: 27 mg/dL — ABNORMAL HIGH (ref 8–23)
CO2: 25 mmol/L (ref 22–32)
Calcium: 8.9 mg/dL (ref 8.9–10.3)
Chloride: 106 mmol/L (ref 98–111)
Creatinine, Ser: 1.42 mg/dL — ABNORMAL HIGH (ref 0.61–1.24)
GFR calc Af Amer: 52 mL/min — ABNORMAL LOW (ref >60)
GFR calc non Af Amer: 45 mL/min — ABNORMAL LOW (ref >60)
Glucose, Bld: 102 mg/dL — ABNORMAL HIGH (ref 70–99)
Potassium: 4 mmol/L (ref 3.5–5.1)
Sodium: 140 mmol/L (ref 135–145)

## 2019-06-23 LAB — BRAIN NATRIURETIC PEPTIDE: B Natriuretic Peptide: 45 pg/mL (ref 0.0–100.0)

## 2019-06-23 LAB — TROPONIN I (HIGH SENSITIVITY): Troponin I (High Sensitivity): 7 ng/L (ref <18)

## 2019-06-23 MED ORDER — PREDNISONE 20 MG PO TABS: 40.0000 mg | ORAL_TABLET | Freq: Every day | ORAL | 0 refills | Status: DC

## 2019-06-23 MED ORDER — METHYLPREDNISOLONE SODIUM SUCC 125 MG IJ SOLR
125.0000 mg | Freq: Once | INTRAMUSCULAR | Status: AC
  Administered 2019-06-23: 125 mg via INTRAVENOUS
  Filled 2019-06-23: qty 2

## 2019-06-23 MED ORDER — DOXYCYCLINE HYCLATE 100 MG PO TABS
100.0000 mg | ORAL_TABLET | Freq: Once | ORAL | Status: AC
  Administered 2019-06-23: 100 mg via ORAL
  Filled 2019-06-23: qty 1

## 2019-06-23 MED ORDER — DOXYCYCLINE HYCLATE 100 MG PO CAPS: 100.0000 mg | ORAL_CAPSULE | Freq: Two times a day (BID) | ORAL | 0 refills | Status: DC

## 2019-06-23 MED ORDER — MAGNESIUM SULFATE 2 GM/50ML IV SOLN
2.0000 g | Freq: Once | INTRAVENOUS | Status: AC
  Administered 2019-06-23: 2 g via INTRAVENOUS
  Filled 2019-06-23: qty 50

## 2019-06-24 DIAGNOSIS — J441 Chronic obstructive pulmonary disease with (acute) exacerbation: Secondary | ICD-10-CM | POA: Diagnosis not present

## 2019-06-25 DIAGNOSIS — H401122 Primary open-angle glaucoma, left eye, moderate stage: Secondary | ICD-10-CM | POA: Diagnosis not present

## 2019-06-25 DIAGNOSIS — H01004 Unspecified blepharitis left upper eyelid: Secondary | ICD-10-CM | POA: Diagnosis not present

## 2019-06-25 DIAGNOSIS — H01001 Unspecified blepharitis right upper eyelid: Secondary | ICD-10-CM | POA: Diagnosis not present

## 2019-06-25 DIAGNOSIS — H401113 Primary open-angle glaucoma, right eye, severe stage: Secondary | ICD-10-CM | POA: Diagnosis not present

## 2019-06-25 DIAGNOSIS — H01002 Unspecified blepharitis right lower eyelid: Secondary | ICD-10-CM | POA: Diagnosis not present

## 2019-06-30 DIAGNOSIS — J449 Chronic obstructive pulmonary disease, unspecified: Secondary | ICD-10-CM | POA: Diagnosis not present

## 2019-07-01 ENCOUNTER — Other Ambulatory Visit: Payer: Self-pay | Admitting: *Deleted

## 2019-07-01 NOTE — Patient Outreach (Signed)
[facility]) Care Management  07/01/2019  Stephen [name] Apr 29, 1932 [id number]   [facility] Telephone Assessment/Screen for [facility] referral  Referral Date:06/25/19 Referral Source: united healthcare medicare Sande Brothers  Referral Reason: [facility] phone [phone] speak with wife [name]. Member has dementia, wife states member has dementia but does not see a neurology or take any medications for it. She states some days he does good and some days he doesn't remember who she is. She mentions he is getting weaker but uses a walker. He has COPD , diabetes and worsening vision due to diabetes She states today his pcp changed Symbicort to Trelegy which she know is expensive. She also states his rhopressa ey drops, tresiba and Humalog ar e expensive He has 2 other eye drips and a nebulizer that are reasonable. She states she me with a lawyer to file bankruptcy and needs all the help she can get. She tried to get member a dexcom but was told medicare would pay for it but [employer] 20% wife currently drives but states she has heart issues and sometime is unable to drive She may need transportation information She is afraid to leave him alone so she cannot go anywhere She would like to have some one to sit with him weekly so she can go to church or go to her appointments   Insurance: united healthcare medicare     Outreach attempt # 1 No answer. [facility] RN CM left HIPAA [facility] Portability and Accountability Act) compliant voicemail message along with CM's contact info.   Plan: [facility] RN CM sent an unsuccessful outreach letter and scheduled this patient for another call attempt within 4 business days   Tambi Thole L. Noelle Penner, RN, BSN, CCM [facility] Telephonic Care Management Care Coordinator Office number [phone] Mobile number [phone]  Main [facility] number [phone] Fax number [phone]

## 2019-07-08 ENCOUNTER — Other Ambulatory Visit: Payer: Self-pay | Admitting: *Deleted

## 2019-07-08 NOTE — Patient Outreach (Signed)
Stephen [name]) Care Management  07/08/2019  Stephen [name]. 1933/01/29 [id number]   [name] Telephone Assessment/Screen for [facility] referral  Referral Date:06/25/19 Referral Source: united healthcare medicare Orlin Hilding  Referral Reason: [name] phone [phone] speak with wife Stephen [name]. Member has dementia, wife states member has dementia but does not see a neurology or take any medications for it. She states some days he does good and some days he doesn't remember who she is. She mentions he is getting weaker but uses a walker. He has COPD , diabetes and worsening vision due to diabetes She states today his pcp changed Symbicort to Trelegy which she know is expensive. She also states his rhopressa ey drops, tresiba and Humalog ar e expensive He has 2 other eye drips and a nebulizer that are reasonable. She states she me with a lawyer to file bankruptcy and needs all the help she can get. She tried to get member a dexcom but was told medicare would pay for it but [employer] 20% wife currently drives but states she has heart issues and sometime is unable to drive She may need transportation information She is afraid to leave him alone so she cannot go anywhere She would like to have some one to sit with him weekly so she can go to church or go to her appointments   Insurance: united healthcare medicare     Outreach attempt # 2 at the home/mobile number  No answer. [facility] RN CM left HIPAA [name] Portability and Accountability Act) compliant voicemail message along with CM's contact info.   Plan: [name] RN CM scheduled this patient for a final call attempt within 4 business days   Willim Turnage L. Lavina Hamman, RN, BSN, [place] Coordinator Office number [phone] Mobile number [phone]  Main [facility] number [phone] Fax number [phone]

## 2019-07-09 ENCOUNTER — Other Ambulatory Visit: Payer: Self-pay | Admitting: *Deleted

## 2019-07-09 NOTE — Patient Outreach (Signed)
[facility]) Care Management  07/09/2019  Stephen [name] October 03, 1932 [id number]   [facility] Telephone Assessment/Screen for [facility] referral  Referral Date:06/25/19 Referral Source:united healthcare [facility]are[facility]  Referral Reason:[employer] phone [phone] speak with wife Stephen [name]. Member has dementia, wife states member has dementia but does not see a neurology or take any medications for it. She states some days he does good and some days he doesn't remember who she is. She mentions he is getting weaker but uses a walker. He has COPD , diabetes and worsening vision due to diabetes She states today his pcp changed Symbicort to Trelegy which she know is expensive. She also states his rhopressa ey drops, tresiba and Humalog ar e expensive He has 2 other eye drips and a nebulizer that are reasonable. She states she me with a lawyer to file bankruptcy and needs all the help she can get. She tried to get member a dexcom but was told medicare would pay for it but [employer] 20% wife currently drives but states she has heart issues and sometime is unable to drive She may need transportation information She is afraid to leave him alone so she cannot go anywhere She would like to have some one to sit with him weekly so she can go to church or go to her appointments  Insurance:united healthcare medicare   Stephen [name] returned a call and left a voice message 07/08/19  Outreach attempt # 3 at the home/mobile number  No answer. [facility] RN CM left HIPAA [facility] Portability and Accountability Act) compliant voicemail message along with CM's contact info.   Plan: [facility] RN CM scheduled this patient for a final call attempt within 4 business days   Brecken Walth L. Lavina Hamman, RN, BSN, [place] Coordinator Office number [phone] Mobile number [phone]  Main [facility] number [phone] Fax number [phone]

## 2019-07-14 ENCOUNTER — Other Ambulatory Visit: Payer: Self-pay | Admitting: *Deleted

## 2019-07-14 NOTE — Patient Outreach (Signed)
[place] [facility]) Care Management  07/14/2019  Jeorge Reister [birth year] [id number]   [facility] Telephone Assessment/Screen for [facility] referral  Referral Date:06/25/19 Referral Source:united healthcare [facility]are[facility]  Referral Reason:[employer] phone [phone] speak with wife [name]. Member has dementia, wife states member has dementia but does not see a neurology or take any medications for it. She states some days he does good and some days he doesn't remember who she is. She mentions he is getting weaker but uses a walker. He has COPD, diabetes and worsening vision due to diabetes She states today his pcp changed Symbicort to Trelegy which she know is expensive. She also states his rhopressa eye drops, tresiba and Humalog are expensive He has 2 other eye drips and a nebulizer that are reasonable. She states she met with a lawyer to file bankruptcy and needs all the help she can get. She tried to get member a dexcom but was told medicare would pay for it but [employer] 20% wife currently drives but states she has heart issues and sometime is unable to drive She may need transportation information She is afraid to leave him alone so she cannot go anywhere She would like to have some one to sit with him weekly so she can go to church or go to her appointments  Insurance:united healthcare medicare   Outreach attempt #4 at the home/mobile number Mr Ryson Bacha answered but reports Mrs [name] was not available and they were busy at this time. He reports "got my hands full" and reports not being able to take a message along with CM's contact info.   [facility] RN CM called to updated Dr Juel Burrow office staff  [facility] RN CM left a message at the MD main line as the office was reported per voice message to be closed for lunch at 1330.  [facility] RN CM left details about the 06/25/19 [facility] referral from [facility], not being able to make contact with the wife as instructed in the referral and  reviewed items that has been reported that Mr Swor may need assistance with per the referral including medication cost and social concerns. Left [facility] RN CM availability and number for return  outreach prn     Plan: [facility] RN CM scheduled this patient forcase closure within the next 10 business days if no return call from wife. [facility] RN CM sent another [facility] unsuccessful outreach letter and a list of local personal care service agencies  Routed note to MD   [place]. Lavina Hamman, RN, BSN, [place] Coordinator Office number [phone] Mobile number [phone]  Main [facility] number [phone] Fax number [phone]

## 2019-07-31 DIAGNOSIS — R600 Localized edema: Secondary | ICD-10-CM | POA: Diagnosis not present

## 2019-07-31 DIAGNOSIS — E1140 Type 2 diabetes mellitus with diabetic neuropathy, unspecified: Secondary | ICD-10-CM | POA: Diagnosis not present

## 2019-08-03 ENCOUNTER — Encounter: Payer: Self-pay | Admitting: *Deleted

## 2019-08-03 ENCOUNTER — Other Ambulatory Visit: Payer: Self-pay | Admitting: *Deleted

## 2019-08-03 NOTE — Patient Outreach (Signed)
[facility]) Care Management  08/03/2019  Tyler Cubit. 1932/05/01 [id number]   Late entry  [facility] Telephone Assessment/Screen for [facility] referral  Referral Date:06/25/19 Referral Source:united healthcare medicareAntoinette [facility]  Referral Reason:UHC phone [phone] speak with wife [name]. Member has dementia, wife states member has dementia but does not see a neurology or take any medications for it. She states some days he does good and some days he doesn't remember who she is. She mentions he is getting weaker but uses a walker. He has COPD, diabetes and worsening vision due to diabetes She states today his pcp changed Symbicort to Trelegy which she know is expensive. She also states his rhopressa eye drops, tresiba and Humalog are expensive He has 2 other eye drips and a nebulizer that are reasonable. She states she met with a lawyer to file bankruptcy and needs all the help she can get. She tried to get member a dexcom but was told medicare would pay for it but UHC 20% wife currently drives but states she has heart issues and sometime is unable to drive She may need transportation information She is afraid to leave him alone so she cannot go anywhere She would like to have some one to sit with him weekly so she can go to church or go to her appointments  Insurance:united healthcare medicare  Return call from Mrs Hincapie after a message was left on 07/14/19 prior to starting [facility] case closure process after difficulty in outreach and engaging with pt and wife They report difficulties using I phone given to them by their daughter She is able to verify HIPAA ([place] and [place]) identifiers, date of birth (DOB) and address as Mr Hessel is reported to have dementia Reviewed and addressed UHC referral to [facility] with her   [facility] referral review She reports Mr Schnoor is receiving therapy in the home She  confirms the referral details are correct  Medication cost concerns $3000/year has been in the donut hole and Pharmacy at Dr Nevada Crane office, "Nate", has been trying to help  Mrs Pucci reports they have made calls to the MD pharmacy without success in outreach  She confirms she DOES NOT want [facility] mail order services as there has been confusion with delivery of medication not needed (example Metformin was stopped related to side effects but they continued to receive  They use Psychiatrist services for medications Mrs Eichenberger administers Mr Hazelrigg medicines to include his insulin injections She has awareness of assisting DM patients as she reports her mother was diabetic   Diabetes DME She report [place] healthcare coverage is wanting $200 per month (UHC 20% patient responsibility/co pay) for cbg monitor via Dexcom to prevent daily finger checks THN does not have resources to assist with insurance co pays   Personal care services/sitters [facility] RN CM discussed [place] personal care services (PCS) options via ADTS (aging, disability and transit services) and possible out of pocket expense Mrs George voiced understanding. She confirms they do not qualify for medicaid services as she reports they are about "fifty dollars over the limit"  Covid vaccine She reports their local drug store is offering the covid vaccine   [facility] RN CM care coordination interventions [facility] RN CM to attempt to speak with MD pharmacy staff for status of medication assistance [facility] RN CM will send Mrs [place] resources for ADTS for pcs and transportation resources and home care providers for local pcs  These  resources will assist with transportation and personal care resources   Financial voiced concerns Mrs Steeves reports to [facility] utilization staff that she filed for bankruptcy  She mentions to [facility] RN CM staff that they are on a fixed income of about $2500 between them both and recently had to replace an air  condition unit at a cost of "five thousand dollars"    Social Mr Kaymon Denomme is a 86 year old male who lives with his wife [name]. They have been married for "71 five years" Mr Wolman is losing his eyesight in his right eye She reports they are both on a fixed income of $36 a month Mrs Wah reports medical issues of her own (cardiac condition, passes out but states she continues to have local driving privileges.  They report support of a daughter Librarian, academic) who lives in [place]  Mrs Otting confirms she has to assist with  Activities of daily living (ADLs) She reports Mr Astorino does not get in the tub any more. He bathes at the sink. He is able to feed and dress himself Mrs Arganbright assists with cutting up his food  His appetite is reported to be good She reports he is provided vitamins and has tried boost  Glucerna was discussed related to his diabetes He has had weight loss He was reported to be 165 lbs but was 220 lbs at 5'8'  Mrs Noren reports having to complete most of the  IADLs (instrumental Activities of Daily Living) for her an Mr Pasquarello (includes lawn care that she was doing prior to returning a call to [place]) Transportation concerns voiced but aware of all available resources She reports she is familiar with the united healthcare transportation and local county transportation (RCATS) benefits. She also states she is aware of the ADTS services and [place] transportation services  She is able to drive locally and she takes Mr Phaneuf out with her as she is afraid of leaving him at home  Reports concerns with non local transportation, like for trips to [place]    Conditions Diabetes (DM) type 29 HgA1c reported as 6.7), Hypertension (HTN), glaucoma, poor vision in right eye, dementia, coronary atherosclerosis, Chronic obstructive pulmonary disease (COPD) mixed, dyspnea, back pain, asbestos exposure, GERD (gastroesophageal reflux disease), non allergic  vasomotor rhinitis, history sinus infection (winter use of antibiotics)   Plans  [facility] RN CM will follow up with Mr & Mrs Ruz within the next 21-28 business days for further assessment, care coordination, education of local resources and follow up in home care   Pt encouraged to return a call to [facility] RN CM prn  [facility] RN CM sent a Gaffer with [facility] brochure, [place], [facility] consent form with return envelope and know before you go sheet enclosed for review  [facility] RN CM discussed the outreach letter already sent on with [facility] brochure enclosed for review  Routed note to MDs/NP/PA  [facility] CM Care Plan Problem One     Most Recent Value  Care Plan Problem One  knoeledge of home care needs for DM, HTN, COPD  Role Documenting the Problem One  Care Management Telephonic Coordinator  Care Plan for Problem One  Active  [facility]N Long Term Goal   over the next 90 days pateitn and wife will be ale to verbalize with outreach interventions to manage COPD, DM and HTN at home  [place] Term Goal Start Date  07/14/19  [facility] CM Short Term Goal #1  over the next 30 days pt/wife will receive resources to assist with personal care services and transportationas evidence by verbalizatin during outreach   [facility] CM Short Term Goal #1 Start Date  07/14/19  Interventions for Short Term Goal #1  sent resources for local adts, and personal care services reviewed united health care transportation benefits  [facility] CM Short Term Goal #2   over the next 30 days pt/wife will receive medication assistance from MD pharmacy staff or [place] staff as verbalized during outreach   [facility] CM Short Term Goal #2 Start Date  07/14/19  Interventions for Short Term Goal #2  to call MD pharmacy to check on status of medication assistance and refer only to [place] staff if MD pharmacy staff is not [place]. Lavina Hamman, RN, BSN, [place] Coordinator Office number [phone] Mobile number  [phone]  Main [facility] number [phone] Fax number [phone]

## 2019-08-03 NOTE — Patient Outreach (Signed)
[place] [facility]) Care Management  08/03/2019  Ceola Mania. Feb 24, 1933 [id number]   [place] coordination- unsuccessful outreaches to MD pharmacy staff   [facility] RN CM called to speak with primary care provider (PCP) office pharmacy staff  Receptionist staff referred [facility] RN CM to the office pharmacy staff [facility] RN CM left messages for Dr Nevada Crane pharmacy staff members Nate [phone] Voice message refers callers to Sharyn Lull and Laurence Ferrari) and [place] ([phone]) [facility] RN CM called but was not able to leave a message for the pharmacy staff member Sharyn Lull [phone]) as the voice mail box was full   Plan [facility] RN CM will update wife and pt pending calls from MD pharmacy staff Routed note to PCP   [place]. Lavina Hamman, RN, BSN, [place] Coordinator Office number [phone] Mobile number [phone]  Main [facility]N number [phone] Fax number [phone]

## 2019-08-05 ENCOUNTER — Other Ambulatory Visit: Payer: Self-pay

## 2019-08-05 ENCOUNTER — Other Ambulatory Visit: Payer: Self-pay | Admitting: *Deleted

## 2019-08-05 NOTE — Patient Outreach (Signed)
[facility] [facility]) Care Management  08/05/2019  Stephen [name]. 1933/02/08 [id number]    [facility] RN CM called but was not able to leave a message for the pharmacy staff member Stephen [name] [phone]) as the voice mail box was full   [facility] Telephone Assessment/Screen for [facility] referral  Referral Date:06/25/19 Referral Source:united healthcare [place]  Referral Reason:UHC phone [phone] speak with wife Stephen [name]. Member has dementia, wife states member has dementia but does not see a neurology or take any medications for it. She states some days he does good and some days he doesn't remember who she is. She mentions he is getting weaker but uses a walker. He has COPD, diabetes and worsening vision due to diabetes She states today his pcp changed Symbicort to Trelegy which she know is expensive. She also states his rhopressa eyedrops, tresiba and Humalog are expensive He has 2 other eye drips and a nebulizer that are reasonable. She states she metwith a Chief Executive Officer to file bankruptcy and needs all the help she can get. She tried to get member a dexcom but was told medicare would pay for it but UHC 20% wife currently drives but states she has heart issues and sometime is unable to drive She may need transportation information She is afraid to leave him alone so she cannot go anywhere She would like to have some one to sit with him weekly so she can go to church or go to her appointments  Insurance:united healthcare medicare  Spoke with Stephen [name] He was able to verify his name today He gave the phone to Stephen [name] She was able to verify HIPAA ([place] and [place]) identifiers, date of birth (DOB) and address Reviewed the unsuccessful attempts to reach the primary care provider (PCP) pharmacy staff She reports unsuccessful attempts also She and [facility] RN CM agreed to a [place] referral for medication assistance related to the cost  of his 2 insulins (tresiba and Humalog), eye drop (rhopressa) and inhaler (Symbicort/Trelegy) as she reports "we are in the donut hole"   [facility] personal care services agencies, ADTS (aging, disability and transit services) and Companion care were received   [facility] RN CM intervention  [facility] pharmacy referral completed for medication assistance Stephen [name] reports Stephen [name] is on boost She reports Stephen [name] has had Covid vaccines x 2 Stephen [name] has assisted him with getting diabetic shoes  She gets assistance at times from neighbors for personal care services to assist to prevent falls    Social Stephen [name] is a 86 year old male who lives with his wife Stephen [name] (who has lung cancer). They have been married for "69 five years" Stephen Perea is losing his eyesight in his right eye She reports they are both on a fixed income of $34 a month Stephen [name] reports medical issues of her own (cardiac condition, passes out but states she continues to have local driving privileges.  They report support of a daughter Stephen [name], Stephen [name]) who lives in [place]  Stephen [place] confirms she has to assist with  Activities of daily living (ADLs) She reports Stephen Withem does not get in the tub any more. He bathes at the sink. He is able to feed and dress himself Stephen Vandegrift assists with cutting up his food  His appetite is reported to be good She reports he is provided vitamins and has tried boost  Glucerna was discussed related to his diabetes He  has had weight loss He was reported to be 165 lbs but was 220 lbs at 5'8'  Stephen Chesnut reports having to complete most of the  IADLs (instrumental Activities of Daily Living) for her an Stephen Mudry (includes lawn care that she was doing prior to returning a call to [place]) Transportation concerns voiced but aware of all available resources She reports she is familiar with the united healthcare transportation and local county transportation (RCATS)  benefits. She also states she is aware of the ADTS services and [place] transportation services  She is able to drive locally and she takes Stephen Lagrand out with her as she is afraid of leaving him at home  Reports concerns with non local transportation, like for trips to [place]    Conditions Diabetes (DM) type 29 HgA1c reported as 6.7), Hypertension (HTN), glaucoma, poor vision in right eye, dementia, coronary atherosclerosis, Chronic obstructive pulmonary disease (COPD) mixed, dyspnea, back pain, asbestos exposure, GERD (gastroesophageal reflux disease), non allergic vasomotor rhinitis, history sinus infection (winter use of antibiotics) Falls 3   Plans [facility] RN CM will follow up with Stephen & Stephen Llamas within the next 30-35 business days to follow up pharmacy concerns Pt encouraged to return a call to [facility] RN CM prn [facility] CM Care Plan Problem One     Most Recent Value  Care Plan Problem One  knoeledge of home care needs for DM, HTN, COPD  Role Documenting the Problem One  Care Management Telephonic Coordinator  Care Plan for Problem One  Active  THN Long Term Goal   over the next 90 days patient and wife will be ale to verbalize with outreach interventions to manage COPD, DM and HTN at home  [place] Term Goal Start Date  07/14/19  Interventions for Problem One Long Term Goal  assessed for worsening symptoms  [facility] CM Short Term Goal #1   over the next 30 days pt/wife will receive resources to assist with personal care services and transportationas evidence by verbalizatin during outreach   [facility] CM Short Term Goal #1 Start Date  07/14/19  [facility] CM Short Term Goal #1 Met Date  08/05/19  [facility] CM Short Term Goal #2   over the next 60 days pt/wife will receive medication assistance from MD pharmacy staff or [facility] pharmacy staff as verbalized during outreach   [facility] CM Short Term Goal #2 Start Date  08/05/19  Interventions for Short Term Goal #2  assesed status of pharmacy referral Discussed [facility] RN CM  [place]. Lavina Hamman, RN, BSN, [place] Coordinator Office number [phone] Mobile number [phone]  Main [facility]N number [phone] Fax number [phone]

## 2019-08-06 ENCOUNTER — Other Ambulatory Visit: Payer: Self-pay | Admitting: *Deleted

## 2019-08-06 DIAGNOSIS — N183 Chronic kidney disease, stage 3 unspecified: Secondary | ICD-10-CM | POA: Diagnosis not present

## 2019-08-06 DIAGNOSIS — J449 Chronic obstructive pulmonary disease, unspecified: Secondary | ICD-10-CM | POA: Diagnosis not present

## 2019-08-06 DIAGNOSIS — E1122 Type 2 diabetes mellitus with diabetic chronic kidney disease: Secondary | ICD-10-CM | POA: Diagnosis not present

## 2019-08-06 DIAGNOSIS — E782 Mixed hyperlipidemia: Secondary | ICD-10-CM | POA: Diagnosis not present

## 2019-08-06 DIAGNOSIS — D696 Thrombocytopenia, unspecified: Secondary | ICD-10-CM | POA: Diagnosis not present

## 2019-08-06 NOTE — Patient Outreach (Signed)
[place] [facility]) Care Management  08/06/2019  Kaaden Bencosme. 08/17/1932 [id number]   [place] coordination- [facility] pharmacy -medication assistance  [facility] RN CM received a call from [place] who states she had a message from [place] to call [facility] RN CM     [facility] RN CM reviewed Mr & Mrs Goldson concerns with medication assistance for insulin, eye drops and inhaler plus the Epic in basket message from [place] stating the [facility] pharmacy referral was sent to upstream Ovid Curd [facility] RN CM answered questions for [facility] RN CM provided the medicines names and Mrs Kiess[place] contact number  Myriam Jacobson states she will call Mr & Mrs Kapala   Plans [facility] RN CM will follow up with Mr & Mrs Throop within the next 14-21 business days to follow up pharmacy concerns Pt encouraged to return a call to [facility] RN CM prn Routed note to MD  [place]. Lavina Hamman, RN, BSN, [place] Coordinator Office number [phone] Mobile number [phone]  Main [facility]N number [phone] Fax number [phone]

## 2019-08-19 ENCOUNTER — Other Ambulatory Visit: Payer: Self-pay | Admitting: *Deleted

## 2019-08-19 ENCOUNTER — Other Ambulatory Visit: Payer: Self-pay

## 2019-08-20 DIAGNOSIS — D229 Melanocytic nevi, unspecified: Secondary | ICD-10-CM | POA: Diagnosis not present

## 2019-08-20 DIAGNOSIS — D696 Thrombocytopenia, unspecified: Secondary | ICD-10-CM | POA: Diagnosis not present

## 2019-08-20 DIAGNOSIS — E1122 Type 2 diabetes mellitus with diabetic chronic kidney disease: Secondary | ICD-10-CM | POA: Diagnosis not present

## 2019-08-20 DIAGNOSIS — E1165 Type 2 diabetes mellitus with hyperglycemia: Secondary | ICD-10-CM | POA: Diagnosis not present

## 2019-08-20 DIAGNOSIS — E1142 Type 2 diabetes mellitus with diabetic polyneuropathy: Secondary | ICD-10-CM | POA: Diagnosis not present

## 2019-08-24 DIAGNOSIS — E1122 Type 2 diabetes mellitus with diabetic chronic kidney disease: Secondary | ICD-10-CM | POA: Diagnosis not present

## 2019-08-24 DIAGNOSIS — D696 Thrombocytopenia, unspecified: Secondary | ICD-10-CM | POA: Diagnosis not present

## 2019-08-24 DIAGNOSIS — Z0001 Encounter for general adult medical examination with abnormal findings: Secondary | ICD-10-CM | POA: Diagnosis not present

## 2019-08-24 DIAGNOSIS — J449 Chronic obstructive pulmonary disease, unspecified: Secondary | ICD-10-CM | POA: Diagnosis not present

## 2019-08-24 DIAGNOSIS — E782 Mixed hyperlipidemia: Secondary | ICD-10-CM | POA: Diagnosis not present

## 2019-09-09 DIAGNOSIS — E782 Mixed hyperlipidemia: Secondary | ICD-10-CM | POA: Diagnosis not present

## 2019-09-09 DIAGNOSIS — E1122 Type 2 diabetes mellitus with diabetic chronic kidney disease: Secondary | ICD-10-CM | POA: Diagnosis not present

## 2019-09-09 DIAGNOSIS — N183 Chronic kidney disease, stage 3 unspecified: Secondary | ICD-10-CM | POA: Diagnosis not present

## 2019-09-09 DIAGNOSIS — J449 Chronic obstructive pulmonary disease, unspecified: Secondary | ICD-10-CM | POA: Diagnosis not present

## 2019-09-09 DIAGNOSIS — D696 Thrombocytopenia, unspecified: Secondary | ICD-10-CM | POA: Diagnosis not present

## 2019-09-09 NOTE — Patient Outreach (Signed)
Stephen [name]) Care Management  09/09/2019  Stephen [name]. [birth year] [id number]   Late entry for 08/19/19 [place] for Stephen [name] referred patient  Referral Date:06/25/19 Referral Source:united healthcare medicareAntoinette [name]  Referral Reason:UHC phone [phone] speak with wife Stephen [name]. Member has dementia, wife states member has dementia but does not see a neurology or take any medications for it. She states some days he does good and some days he doesn't remember who she is. She mentions he is getting weaker but uses a walker. He has COPD, diabetes and worsening vision due to diabetes She states today his pcp changed Symbicort to Trelegy which she know is expensive. She also states his rhopressa eyedrops, tresiba and Humalog are expensive He has 2 other eye drips and a nebulizer that are reasonable. She states she metwith a Chief Executive Officer to file bankruptcy and needs all the help she can get. She tried to get member a dexcom but was told medicare would pay for it but UHC 20% wife currently drives but states she has heart issues and sometime is unable to drive She may need transportation information She is afraid to leave him alone so she cannot go anywhere She would like to have some one to sit with him weekly so she can go to church or go to her appointments  Insurance:united healthcare medicare  Follow up assessment Successful outreach to wife, Stephen [name] as patient with Dementia Stephen [name] is able to verify HIPAA ([place] and Accountability Act) identifiers, date of birth (DOB) and address   Stephen [name] discussed the removal of teeth ($314) and the cost for a partial denture ($1700).  [facility] RN CM was able to provide name of in network dentists listed [name] the online  [place] care [facility]) site to include Stephen [name] [phone] and Stephen [name] [phone].   The last pulmonology visit was cancelled (Dr Annamaria Boots)  [facility] RN CM  offered information about the local [facility] program after Stephen [name] discussed concerns with the present insurance coverage  Diabetes Stephen [name] states she is assisting in managing diabetes Last HgA1c =10.1  [facility] letter and forms received and need to be returned   Plans [facility] RN CM will follow up with Stephen [name] within the next 30-35 business days  Pt encouraged to return a call to [name] RN CM prn   Joelene Millin L. Lavina Hamman, RN, BSN, [place] Coordinator Office number [phone] Mobile number [phone]  Main [facility]N number [phone] Fax number [phone]

## 2019-09-10 ENCOUNTER — Ambulatory Visit: Payer: Medicare Other | Admitting: Internal Medicine

## 2019-09-10 DIAGNOSIS — M549 Dorsalgia, unspecified: Secondary | ICD-10-CM | POA: Diagnosis not present

## 2019-09-21 ENCOUNTER — Ambulatory Visit: Payer: Self-pay | Admitting: *Deleted

## 2019-09-22 ENCOUNTER — Ambulatory Visit: Payer: Self-pay | Admitting: *Deleted

## 2019-09-23 ENCOUNTER — Other Ambulatory Visit: Payer: Self-pay

## 2019-09-23 ENCOUNTER — Other Ambulatory Visit: Payer: Self-pay | Admitting: *Deleted

## 2019-09-23 NOTE — Patient Outreach (Signed)
[facility]) Care Management  09/23/2019  Stephen [name] [birth year] [id number]   [place] for [facility] referred patient  Referral Date:06/25/19 Referral Source:united healthcare medicareAntoinette [name]  Referral Reason:UHC phone [phone] speak with wife Stephen [name]. Member has dementia, wife states member has dementia but does not see a neurology or take any medications for it. She states some days he does good and some days he doesn't remember who she is. She mentions he is getting weaker but uses a walker. He has COPD, diabetes and worsening vision due to diabetes She states today his pcp changed Symbicort to Trelegy which she know is expensive. She also states his rhopressa eyedrops, tresiba and Humalog are expensive He has 2 other eye drips and a nebulizer that are reasonable. She states she metwith a Chief Executive Officer to file bankruptcy and needs all the help she can get. She tried to get member a dexcom but was told medicare would pay for it but UHC 20% wife currently drives but states she has heart issues and sometime is unable to drive She may need transportation information She is afraid to leave him alone so she cannot go anywhere She would like to have some one to sit with him weekly so she can go to church or go to her appointments  Insurance:united healthcare medicare  No admissions in 2021 only an ED visit on 06/22/19 for shortness of breath (sob)    Follow up assessment Successful outreach to wife, Stephen [name] as patient with Dementia Stephen [name] is able to verify HIPAA ([place] and Accountability Act) identifiers, date of birth (DOB) and address  pharmacy program assist  Continues, got Symbicort and one insulin  Stephen [name] is aware   Diabetes  Had a 400 cbg x 1 when he did not felt good  This week cbg values 88- 102 He is still doing his own cbg monitor still now able to get He is not able to give himself insulin injection as he  shakes  was going to Stephen [name] in [place] for years when he had a HgA1c =10.1 Now 6.7 with Stephen [name] in May 2021  Gives a snack hs Increase hs voiding or getting up to void which makes him sleep during the day Not on diuretic Stephen Stephen [name] believes it is related to him forgetting that he has voided or not wanting to void on himself She reports at times he has pulled off his clothes but has not soiled them She confirms not incontinence of bowel nor bladder  [facility] RN CM discussed prostrate concerns that prevent completely emptying He wears diaper for a period of time until he had an episode of shingles  Shingles resolved with antibacterial soap and cornstarch powder  Dementia Stephen [name] follows for this and does not want to give medications related to his abnormal kidneys   Still uses walker for ambulation but some days he does not remember wife and sleeps a lot  His appetite poor intake of Glucerna and boost  He also has worn down teeth and is not able to afford dentures   COPD better in summer than in the fall,  takes mucinex with success [facility] RN CM discussed nasal irrigation  Pt is afraid per pt to do nasal irrigation The last pulmonology visit was cancelled (Stephen Annamaria Boots)  [facility] RN CM offered information about the local [facility] program after Stephen [name] discussed concerns with the present insurance coverage and confirms she will be contacting [facility] near [facility]  enrollment time (October)   [facility] RN CM interventions Sent EMMI materials on parkinson disease, BPH (enlarged prostate), COPD: what you can do, High blood pressure (hypertension): what you can do and Type 2 Diabetes: taking care of yourself fay to day   Social Stephen [name] is a 86 year old male who lives with his wife Stephen [name]. 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  They report support of a daughter Librarian, academic) who lives in [place]  They also has a son Stephen [name] confirms she has to assist with  Activities of daily living (ADLs) She reports Stephen [name] does not get in the tub any more. He bathes at the sink. He is able to feed and dress himself Stephen [name] assists with cutting up his food  His appetite is reported to be good She reports he is provided vitamins and has tried boost  Glucerna was discussed related to his diabetes He has had weight loss He was reported to be 165 lbs but was 220 lbs at 5'8'  Stephen [name] reports having to complete most of the  IADLs (instrumental Activities of Daily Living) for her an Stephen [name] (includes lawn care that she was doing prior to returning a call to [place]) Transportation concerns voiced but aware of all available resources She reports she is familiar with the united healthcare transportation and local county transportation (RCATS) benefits. She also states she is aware of the ADTS services and [place] transportation services  She is able to drive locally and she takes Stephen [name] out with her as she is afraid of leaving him at home  Reports concerns with non local transportation, like for trips to [place]    Conditions Diabetes (DM) type 29 HgA1c reported as 6.7), Hypertension (HTN), glaucoma, poor vision in right eye, dementia, coronary atherosclerosis, Chronic obstructive pulmonary disease (COPD) mixed, dyspnea, back pain, asbestos exposure, GERD (gastroesophageal reflux disease), non allergic vasomotor rhinitis, history sinus infection (winter use of antibiotics)   [facility] RN CM will follow up with Stephen Mussa within the next 40 business days  Pt encouraged to return a call to [facility] RN CM prn  Routed note to MD  [facility] CM Care Plan Problem One      Most Recent Value  Care Plan Problem One knowledge of home care needs for DM, HTN, COPD  Role Documenting the Problem One Care Management Telephonic Coordinator  Care Plan for Problem One Active  [facility] Long Term Goal  over the next 90 days patient and wife will be ale to verbalize with outreach interventions to manage COPD, DM and HTN at home  [place] Goal Start Date 09/23/19  Interventions for Problem One Long Term Goal assessed for pharmacy concerns, assessed for status and worsening s/s for HTN, COPD and DM, discussed increase attempts to void prostrate and parkinson symptoms assessed home action plans sent EMMI for parkinson and prostrate care Sent EMMI materials on parkinson disease, BPH (enlarged prostate), COPD: what you can do, High blood pressure (hypertension): what you can do and Type 2 Diabetes: taking care of yourself fay to day  [facility] CM Short  Term Goal #1  over the next 30 days pt/wife will receive resources to assist with personal care services and transportationas evidence by verbalizatin during outreach   [facility] CM Short Term Goal #1 Start Date 07/14/19  [facility] CM Short Term Goal #1 Met Date 08/05/19  [facility] CM Short Term Goal #2  over the next 60 days pt/wife will receive medication assistance from MD pharmacy staff or [place] staff as verbalized during outreach   [facility] CM Short Term Goal #2 Start Date 08/05/19  Interventions for Short Term Goal #2 assessed for pharmacy concerns,       Joelene Millin L. Lavina Hamman, RN, BSN, [place] Coordinator Office number [phone] Mobile number [phone]  Main [facility] number [phone]

## 2019-09-24 DIAGNOSIS — H01004 Unspecified blepharitis left upper eyelid: Secondary | ICD-10-CM | POA: Diagnosis not present

## 2019-09-24 DIAGNOSIS — H01002 Unspecified blepharitis right lower eyelid: Secondary | ICD-10-CM | POA: Diagnosis not present

## 2019-09-24 DIAGNOSIS — H01001 Unspecified blepharitis right upper eyelid: Secondary | ICD-10-CM | POA: Diagnosis not present

## 2019-09-24 DIAGNOSIS — H401122 Primary open-angle glaucoma, left eye, moderate stage: Secondary | ICD-10-CM | POA: Diagnosis not present

## 2019-09-24 DIAGNOSIS — H401113 Primary open-angle glaucoma, right eye, severe stage: Secondary | ICD-10-CM | POA: Diagnosis not present

## 2019-10-27 DIAGNOSIS — N183 Chronic kidney disease, stage 3 unspecified: Secondary | ICD-10-CM | POA: Diagnosis not present

## 2019-10-27 DIAGNOSIS — E785 Hyperlipidemia, unspecified: Secondary | ICD-10-CM | POA: Diagnosis not present

## 2019-10-27 DIAGNOSIS — M549 Dorsalgia, unspecified: Secondary | ICD-10-CM | POA: Diagnosis not present

## 2019-10-27 DIAGNOSIS — E1122 Type 2 diabetes mellitus with diabetic chronic kidney disease: Secondary | ICD-10-CM | POA: Diagnosis not present

## 2019-10-30 DIAGNOSIS — J449 Chronic obstructive pulmonary disease, unspecified: Secondary | ICD-10-CM | POA: Diagnosis not present

## 2019-11-05 DIAGNOSIS — E1122 Type 2 diabetes mellitus with diabetic chronic kidney disease: Secondary | ICD-10-CM | POA: Diagnosis not present

## 2019-11-05 DIAGNOSIS — E7849 Other hyperlipidemia: Secondary | ICD-10-CM | POA: Diagnosis not present

## 2019-11-05 DIAGNOSIS — I129 Hypertensive chronic kidney disease with stage 1 through stage 4 chronic kidney disease, or unspecified chronic kidney disease: Secondary | ICD-10-CM | POA: Diagnosis not present

## 2019-11-05 DIAGNOSIS — N189 Chronic kidney disease, unspecified: Secondary | ICD-10-CM | POA: Diagnosis not present

## 2019-11-06 ENCOUNTER — Other Ambulatory Visit: Payer: Self-pay

## 2019-11-06 ENCOUNTER — Other Ambulatory Visit: Payer: Self-pay | Admitting: *Deleted

## 2019-11-06 NOTE — Patient Outreach (Addendum)
[facility]) Care Management  11/06/2019  Stephen [name] [birth year] [id number]   [facility] Outreach for [facility] referred patient  Referral Date:06/25/19 Referral Source:united healthcare medicareAntoinette [facility]  Referral Reason:UHC phone [phone] speak with wife Stephen [name]. Member has dementia, wife states member has dementia but does not see a neurology or take any medications for it. She states some days he does good and some days he doesn't remember who she is. She mentions he is getting weaker but uses a walker. He has COPD, diabetes and worsening vision due to diabetes She states today his pcp changed Symbicort to Trelegy which she know is expensive. She also states his rhopressa eyedrops, tresiba and Humalog are expensive He has 2 other eye drips and a nebulizer that are reasonable. She states she metwith a Clinical research associate to file bankruptcy and needs all the help she can get. She tried to get member a dexcom but was told medicare would pay for it but UHC 20% wife currently drives but states she has heart issues and sometime is unable to drive She may need transportation information She is afraid to leave him alone so she cannot go anywhere She would like to have some one to sit with him weekly so she can go to church or go to her appointments  Insurance:united healthcare medicare  No admissions in 2021 only an ED visit on 3/22  Outreach to Stephen & Stephen Noland successful via the home number  Successful outreach to wife, Stephen [name] as patient with Dementia Stephen derrel litteken to verify HIPAA [employer] Portability and Accountability Act) identifiers, date of birth (DOB) and address  Stephen Fukuda report they are doing okay except some ongoing medication issue but they are working with their pharmacy staff at Dr Margo Aye office  Last outreach from [employer] office ws a week ago  Stephen Kenerson confirms PCP pharmacy staff is changing new management within the next month.  Issues being resolved for Symbicort and tresiba Offered toujelo  COPD had issues during the temperature increase His wife dicussed possible some increase in symptoms related to "fire" He was having sob and was encouraged not to go outside only to mail box or garbage dumpster  Diabetes cbg90-103 Dr want 90 150  120 cbg value today as had diarrhea on 11/05/19  Was 300 one day with new toujelo 'leveling off" HgA1c .6.7  States has not received EMMI Believes may be lost in mail   [facility] RN CM inquired about the need of [facility] SW for financial concerns but she denied the need She voiced concern about a bill going primary online. 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 In a double wide trailer for 19 years now. 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  They report support of a daughter Education officer, community) who lives in [place]  They also has a son Stephen Poliseno confirms she has to assist withActivities of daily living (ADLs)  She reports Stephen [name] does not get in the tub any more. He bathes at the sink. He is able to feed and dress himself Stephen [name] assists with cutting up his food  His appetite is reported to be good She reports he is provided vitamins and has tried boost  Glucerna was discussed related to his diabetes He has had weight loss He was reported to be 165 lbs but was 220 lbs at 5'8'  Stephen Bieger reports having to complete most of theIADLs (instrumental Activities of Daily Living) for her an Stephen Billock (includes lawn care that she was doing prior to returning a call to [facility] RN CM)  Transportation concerns voiced but aware of all available resources She reports she is familiar with the united healthcare transportation and local county transportation (RCATS) benefits. She also states she is aware of the ADTS services and [place] transportation services  She is able to drive locally and she takes Stephen [name] out with her as she is afraid of leaving him at home  Reports concerns with non local transportation, like for trips to [place]    ConditionsDiabetes (DM) type29 HgA1c reported as 6.7),Hypertension (HTN), glaucoma, poor vision in right eye, dementia, coronary atherosclerosis,Chronic obstructive pulmonary disease (COPD)mixed, dyspnea, back pain, asbestos exposure,GERD (gastroesophageal reflux disease), non allergic vasomotor rhinitis, history sinus infection (winter use of antibiotics)   Appointment  11/18/19 Dr Craige Cotta Pulmonology pcp end of 12/2019 6 months f/u    [facility] RN CM will follow up within 30 business days Pt encouraged to return a call to [facility] RN CM prn Routed note to MD Goals Addressed              This Visit's Progress     Patient Stated   .  Patient/Family will be able to manage DM, COPD at home (pt-stated)        CARE PLAN ENTRY (see longtitudinal plan of care for additional care plan information)  Objective:  Lab Results  Component Value Date   HGBA1C 7.5  02/18/2017 .   Lab Results  Component Value Date   CREATININE 1.42 (H) 06/22/2019   CREATININE 1.32 09/17/2013   CREATININE 1.16 10/12/2009 .   Marland Kitchen No results found for: EGFR  Current Barriers:  Marland Kitchen Knowledge Deficits related to basic Diabetes pathophysiology and self care/management . Difficulty obtaining or cannot afford medications . Cognitive Deficits  Case Manager Clinical Goal(s):  Over the next 90 days, patient/wife will demonstrate improved adherence to prescribed treatment plan for diabetes self care/management as evidenced by: cbg range 90-150  over the next 60 days pt/wife will receive medication assistance from MD pharmacy staff or [facility] pharmacy staff as verbalized during outreach  . Verbalize adherence to prescribed medication regimen within the next 31 days   Interventions:  . Reviewed medications with patient and discussed importance of medication adherence . Discussed plans with patient for ongoing care management follow up and provided patient with direct contact information  for care management team . Reviewed scheduled/upcoming provider appointments including: pcp , specialists . Discussed [facility] pharmacy services and offered a referral   Patient Self Care Activities:  . Self administers insulin as prescribed . Attends all scheduled provider appointments . Checks blood sugars as prescribed and utilize hyper and hypoglycemia protocol as needed . Adheres to prescribed ADA/carb modified  Initial goal documentation Please see other previous [facility] care plan information listed in Epic under the flow sheet section          Geoffery Aultman L. Noelle Penner, RN, BSN, CCM [facility] Telephonic Care Management Care Coordinator Office number [phone] Mobile number [phone]  Main [facility]N number [phone] Fax number [phone]

## 2019-11-12 DIAGNOSIS — R197 Diarrhea, unspecified: Secondary | ICD-10-CM | POA: Diagnosis not present

## 2019-11-18 ENCOUNTER — Other Ambulatory Visit: Payer: Self-pay

## 2019-11-18 ENCOUNTER — Encounter: Payer: Self-pay | Admitting: Pulmonary Disease

## 2019-11-18 ENCOUNTER — Ambulatory Visit (INDEPENDENT_AMBULATORY_CARE_PROVIDER_SITE_OTHER): Payer: Medicare Other | Admitting: Pulmonary Disease

## 2019-11-18 VITALS — BP 118/70 | HR 74 | Temp 98.0°F | Ht 68.0 in | Wt 159.4 lb

## 2019-11-18 DIAGNOSIS — J310 Chronic rhinitis: Secondary | ICD-10-CM | POA: Diagnosis not present

## 2019-11-18 DIAGNOSIS — J920 Pleural plaque with presence of asbestos: Secondary | ICD-10-CM | POA: Diagnosis not present

## 2019-11-18 DIAGNOSIS — J449 Chronic obstructive pulmonary disease, unspecified: Secondary | ICD-10-CM

## 2019-11-18 DIAGNOSIS — J342 Deviated nasal septum: Secondary | ICD-10-CM | POA: Diagnosis not present

## 2019-11-18 MED ORDER — BUDESONIDE-FORMOTEROL FUMARATE 160-4.5 MCG/ACT IN AERO: 2.0000 | INHALATION_SPRAY | Freq: Two times a day (BID) | RESPIRATORY_TRACT | 3 refills | Status: AC

## 2019-11-18 MED ORDER — ALBUTEROL SULFATE (2.5 MG/3ML) 0.083% IN NEBU: 2.5000 mg | INHALATION_SOLUTION | Freq: Four times a day (QID) | RESPIRATORY_TRACT | 3 refills | Status: AC | PRN

## 2019-11-18 NOTE — Patient Instructions (Signed)
Try using saline nasal spray to help clear sinus congestion.  If you are still having trouble, then you can also try flonase 1 spray in each nostril daily as needed.  Follow up in 1 year

## 2019-11-18 NOTE — Progress Notes (Signed)
[name]  Chief Complaint  Patient presents with  . Follow-up    No complaints    Constitutional:  BP 118/70 (BP Location: Left Arm, Cuff Size: Normal)   Pulse 74   Temp 98 F (36.7 C) (Other (Comment)) Comment (Src): Wrist  Ht 5\' 8"  (1.727 m)   Wt 159 lb 6.4 oz (72.3 kg)   SpO2 95% Comment: Room air  BMI 24.24 kg/m   Past Medical History:  CAD, DM, GERD, Glaucoma, CKD 3a, HLD, HTN  Summary:  Stephen [name]. is a 86 y.o. 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  Subjective:   Previously followed by [name].  He broke his nose few years ago.  Has constant drainage from this.  Gets occasional nose bleeds also.  Not having cough, wheeze, sputum, chest congestion, leg swelling.    Symbicort helps.  Not needing albuterol much.  CXR from 06/22/19 shows pleural plaques.  No ILD.  Physical Exam:   Appearance - well kempt  ENMT - no sinus tenderness, no nasal discharge, no oral exudate, Mallampati 2, deviated nasal septum  Respiratory - decreased BS, prolonged exhalation, no wheeze, or rales  CV - regular rate and rhythm, no murmurs  GI - soft, non tender  Lymph - no adenopathy noted in neck  Ext - no edema  Skin - no rashes  Neuro - normal strength, oriented x 3  Psych - normal mood and affect   Assessment/Plan:   COPD with chronic bronchitis. - continue symbicort and prn albuterol - he wouldn't be candidate for LAMA given history of glaucoma  Chronic rhinitis with deviated nasal septum. - advised him to try nasal irrigation first and add flonase if needed  Asbestos related pleural plaques. - most recent CXR didn't show evidence for ILD - monitor clinically   A total of 33 minutes spent addressing patient care issues on day of visit.  Follow up:  Patient Instructions  Try using saline nasal spray to help clear sinus congestion.  If you are  still having trouble, then you can also try flonase 1 spray in each nostril daily as needed.  Follow up in 1 year   Signature:  [name], MD [place] Pager: [phone] 11/18/2019, 10:39 AM  Flow Sheet     Pulmonary tests:   PFT 02/02/08 >> FEV1 2.09 (78%), FEV1% 58, TLC 5.81 (97%), DLCO 67%  Chest imaging:   CT chest 02/09/05 >> b/l pleural plaques, fatty liver  Medications:   Allergies as of 11/18/2019      Reactions   Codeine    REACTION: nausea   Morphine Other (See Comments)   Caused hypersensitivity. Patient states it "really made me high"      Medication List       Accurate as of November 18, 2019 10:39 AM. If you have any questions, ask your nurse or doctor.        STOP taking these medications   doxycycline 100 MG capsule Commonly known as: VIBRAMYCIN Stopped by: [name], MD   gabapentin 100 MG capsule Commonly known as: NEURONTIN Stopped by: [name], MD   predniSONE 20 MG tablet Commonly known as: DELTASONE Stopped by: [name], MD     TAKE these medications   aspirin EC 81 MG tablet Take 81 mg by mouth every Monday, Wednesday, and Friday.   brimonidine 0.2 % ophthalmic solution Commonly known as:  ALPHAGAN Place 1 drop into the right eye 2 (two) times daily.   budesonide-formoterol 160-4.5 MCG/ACT inhaler Commonly known as: Symbicort Inhale 2 puffs into the lungs 2 (two) times daily. RINSE MOUTH AFTER USE   insulin lispro 100 UNIT/ML injection Commonly known as: HUMALOG Inject 22 Units into the skin daily.   isosorbide mononitrate 60 MG 24 hr tablet Commonly known as: IMDUR TAKE ONE TABLET BY MOUTH DAILY.   latanoprost 0.005 % ophthalmic solution Commonly known as: XALATAN Place 1 drop into both eyes At bedtime.   ONE TOUCH ULTRA TEST test strip Generic drug: glucose blood Use as directed daily.   OneTouch Delica Lancets 65K Misc Use as directed daily.   ProAir HFA 108 (90 Base) MCG/ACT  inhaler Generic drug: albuterol Inhale 1-2 puffs into the lungs every 6 (six) hours as needed for wheezing or shortness of breath. What changed: Another medication with the same name was changed. Make sure you understand how and when to take each. Changed by: [name], MD   albuterol 108 (90 Base) MCG/ACT inhaler Commonly known as: VENTOLIN HFA Inhale 2 puffs into the lungs every 6 (six) hours as needed for wheezing or shortness of breath. What changed: Another medication with the same name was changed. Make sure you understand how and when to take each. Changed by: [name], MD   albuterol (2.5 MG/3ML) 0.083% nebulizer solution Commonly known as: PROVENTIL Take 3 mLs (2.5 mg total) by nebulization every 6 (six) hours as needed for wheezing or shortness of breath. What changed: See the new instructions. Changed by: [name], MD   Rhopressa 0.02 % Soln Generic drug: Netarsudil Dimesylate Apply 1 drop to eye at bedtime.   Rocklatan 0.02-0.005 % Soln Generic drug: Netarsudil-Latanoprost Apply 1 drop to eye at bedtime.   rosuvastatin 5 MG tablet Commonly known as: CRESTOR Take 5 mg by mouth daily at 6 PM.   Sure Comfort Pen Needles 32G X 4 MM Misc Generic drug: Insulin Pen Needle USE AS DIRECTED 3 TIMESODAILY.   Tyler Aas FlexTouch 200 UNIT/ML FlexTouch Pen Generic drug: insulin degludec Inject 32 Units as directed every morning.       Past Surgical History:  He  has a past surgical history that includes Inguinal hernia repair; Cardiac catheterization; Cholecystectomy; CATARACTS; Cardiac catheterization; and Cataract extraction w/PHACO (Left, 09/21/2013).  Family History:  His family history includes Diabetes type I in his brother; Heart disease in his father; Lung cancer in his father.  Social History:  He  reports that he quit smoking about 18 years ago. He has a 100.00 pack-year smoking history. He has never used smokeless tobacco. He reports previous alcohol use.  He reports that he does not use drugs.

## 2019-11-20 ENCOUNTER — Other Ambulatory Visit: Payer: Self-pay | Admitting: *Deleted

## 2019-11-23 NOTE — Patient Outreach (Signed)
[facility]) Care Management  11/23/2019  Stephen [name]. 15-May-1932 [id number]   Man coordination- Collaboration with pcp office staff  [facility] RN CM called Dr Nevada Crane office with attempt to speak with pharmacy staff Spoke with Amy about [name] voice concerns She confirms [facility] pharmacists are not available in the office Verified the number wife and [facility] RN CM attempt to call to outreach  Amy discussed pt/wife outreaching to the pcp office [facility] RN CM inquired if a call could be placed to the wife to update her on the notes Amy reviewed indicating patient assistance as being worked on for [name]'s insulin    [facility] RN CM will outreach to pt/wife within the next 14-21 business days   [employer] Lavina Hamman, RN, BSN, [place] Coordinator Office number [phone] Mobile number [phone]  Main [facility]N number [phone] Fax number [phone]

## 2019-11-23 NOTE — Patient Outreach (Signed)
[facility]) Care Management  11/23/2019  Chavis Arvelo. [birth year] [id number]   Late entry for 11/20/19 1633  [facility] Outreach for [facility] referred patient after a voice message was left by wife  Referral Date:06/25/19 Referral Source:united healthcare medicareAntoinette [facility]  Referral Reason:UHC phone [phone] speak with wife [name]. Member has dementia, wife states member has dementia but does not see a neurology or take any medications for it. She states some days he does good and some days he doesn't remember who she is. She mentions he is getting weaker but uses a walker. He has COPD, diabetes and worsening vision due to diabetes She states today his pcp changed Symbicort to Trelegy which she know is expensive. She also states his rhopressa eyedrops, tresiba and Humalog are expensive He has 2 other eye drips and a nebulizer that are reasonable. She states she metwith a Clinical research associate to file bankruptcy and needs all the help she can get. She tried to get member a dexcom but was told medicare would pay for it but UHC 20% wife currently drives but states she has heart issues and sometime is unable to drive She may need transportation information She is afraid to leave him alone so she cannot go anywhere She would like to have some one to sit with him weekly so she can go to church or go to her appointments  Insurance:united healthcare medicare  No admissions in 2021 only an ED visit on 3/22  Mrs reported in a voice message left on 11/20/19 0844 the she wanted a call back to her as she needed som help with some things  Outreach to Mr & Mrs Saile successful via the home number  Successful outreach to wife, [name] as patient with Dementia Mrs michaelvincent wille to verify HIPAA [employer] Portability and Accountability Act) identifiers, date of birth (DOB) and address  Present concerns- Medications Mrs Guzzardo reports she is having concerns with receiving  return calls from her pcp pharmacy staff related to her inhaler and Mr Mcclaren's Insulin  Mrs Oharrow voices their bankruptcy state related to their fixed income This makes it difficult to purchase medicines even with her daughter assistance   Update Mrs Ramsier reports recently incontinence of bowels, diarrhea He was seen by "Casimiro Needle" and started on Metamucil which is helping [facility] RN CM and his wife discussed foods not to eat related to diarrhea and foods to take in to bulk up stool to include meats, bananas, beans. She is holding his stool softener at this time.  Her daughter has assisted with getting extra incontinence supplies via [employer].  Mrs Kanhai continues to use [place] healthcare over the counter (OTC) program to get supplies also but reports they have cost concern with incontinence pads [facility] RN CM to attempt to assist by calling the MD office   Appointments September 3 Labs &7 to see MD -PCP office visit  Plans  [facility] RN CM to follow up with pt/wife within the next 7-14 business days after placing a call to PCP office on 11/23/19  Routed note to MD Pt encouraged to return a call to [facility] RN CM prn  Goals Addressed              This Visit's Progress     Patient Stated   .  Patient/Family will be able to manage DM, COPD at home (pt-stated)   Not on track     CARE PLAN ENTRY (see longtitudinal plan of care for additional care  plan information)  Objective:  Lab Results  Component Value Date   HGBA1C 7.5 02/18/2017 .   Lab Results  Component Value Date   CREATININE 1.42 (H) 06/22/2019   CREATININE 1.32 09/17/2013   CREATININE 1.16 10/12/2009 .   Marland Kitchen No results found for: EGFR  Current Barriers:  Marland Kitchen Knowledge Deficits related to basic Diabetes pathophysiology and self care/management . Difficulty obtaining or cannot afford medications . Cognitive Deficits  Case Manager Clinical Goal(s):  Over the next 90 days, patient/wife will demonstrate improved adherence to  prescribed treatment plan for diabetes self care/management as evidenced by: cbg range 90-150  over the next 60 days pt/wife will receive medication assistance from MD pharmacy staff or [facility] pharmacy staff as verbalized during outreach  . Verbalize adherence to prescribed medication regimen within the next 31 days  . 11/20/19 reports continued medication cost concerns   Interventions:  . Reviewed medications with patient and discussed importance of medication adherence . Discussed plans with patient for ongoing care management follow up and provided patient with direct contact information for care management team . Reviewed scheduled/upcoming provider appointments including: pcp , specialists . Discussed [facility] pharmacy services and offered a referral  . Updated her on the pcp pharmacy services .   Patient Self Care Activities:  . Self administers insulin as prescribed . Attends all scheduled provider appointments . Checks blood sugars as prescribed and utilize hyper and hypoglycemia protocol as needed . Adheres to prescribed ADA/carb modified  Initial goal documentation Please see other previous [facility] care plan information listed in Epic under the flow sheet section          Ingvald Theisen L. Noelle Penner, RN, BSN, CCM [facility] Telephonic Care Management Care Coordinator Office number [phone] Mobile number [phone]  Main [facility] number [phone] Fax number [phone]

## 2019-12-03 DIAGNOSIS — L989 Disorder of the skin and subcutaneous tissue, unspecified: Secondary | ICD-10-CM | POA: Diagnosis not present

## 2019-12-03 DIAGNOSIS — L820 Inflamed seborrheic keratosis: Secondary | ICD-10-CM | POA: Diagnosis not present

## 2019-12-03 DIAGNOSIS — E1165 Type 2 diabetes mellitus with hyperglycemia: Secondary | ICD-10-CM | POA: Diagnosis not present

## 2019-12-03 DIAGNOSIS — D229 Melanocytic nevi, unspecified: Secondary | ICD-10-CM | POA: Diagnosis not present

## 2019-12-08 ENCOUNTER — Other Ambulatory Visit: Payer: Self-pay | Admitting: *Deleted

## 2019-12-08 ENCOUNTER — Encounter: Payer: Self-pay | Admitting: *Deleted

## 2019-12-08 ENCOUNTER — Other Ambulatory Visit: Payer: Self-pay

## 2019-12-08 NOTE — Patient Outreach (Addendum)
[facility]) Care Management  12/08/2019  Stephen [name]. 1932-07-14 [id number]  Stephen [name] Outreach for Stephen [name] referred patient - complex care   Referral Date:06/25/19 Referral Source:united healthcare medicareAntoinette [facility]  Initial Referral Reason:UHC phone [phone] speak with wife Stephen [name]. Member has dementia, wife states member has dementia but does not see a neurology or take any medications for it. She states some days he does good and some days he doesn't remember who she is. She mentions he is getting weaker but uses a walker. He has COPD, diabetes and worsening vision due to diabetes She states today his pcp changed Symbicort to Trelegy which she know is expensive. She also states his rhopressa eyedrops, tresiba and Humalog are expensive He has 2 other eye drips and a nebulizer that are reasonable. She states she metwith a Clinical research associate to file bankruptcy and needs all Stephen help she can get. She tried to get member a dexcom but was told medicare would pay for it but UHC 20% wife currently drives but states she has heart issues and sometime is unable to drive She may need transportation information She is afraid to leave him alone so she cannot go anywhere She would like to have some one to sit with him weekly so she can go to church or go to her appointments  Insurance:united healthcare medicare      Update Stephen [name] has not received return calls from [facility] She reports she sent paper work to MD office and states she is awaiting response from MD office She reports she and patient went to MD office last Thursday 12/03/19 (labs), go back 12/10/19  She reports they got assist with Symbicort Still need insulin for pt She reports she did get a call from her pulmonologist to assist her Forms returned  She reports Stephen [name] cbg has been elevated since on Toujeo  She does not think his cbg is up r/t to diarrhea or use of miralax as she purchased a sugar free  miralax He has uses up all ensure and is using boost She and [facility] RN CM discussed non milk based nutritional drinks like ensure clear, premier, etc  She was cautioned on use of regular Gatorade vs zero Gatorade related to sugar intake   Wife is [facility] with [facility] as she discussed caregiver assistance for Dementia patients. She report   Stephen [name] inquired about low income housing and assisted living facilities locally [facility] RN CM discussed differences in skilled nursing facility (snf), independent facilities and assisted living facilities She then confirmed Stephen [name] would not need a skilled nursing facility (snf) vs an assisted living facility as she now has to do most of his ADLs as well as all iADLs  [facility] for [facility]  [facility] SW referral completed/ordered    SocialMr Vernor [name] is a 86 year old male who lives with his wife Stephen [name] in a double wide trailer for 19 years now. No computer but has a tablet with Internet. 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  They report support of a  daughter Education officer, community) who lives in [place] They also has a son with limited support Stephen Adames confirms she has to assist withActivities of daily living (ADLs) She reports Stephen [name] does not get in Stephen tub any more. He bathes at Stephen sink. He is able to feed and dress himself Stephen [name] assists with cutting up his food  His appetite is reported to be good She reports he is provided vitamins and has tried boost but takes in Ensure Glucerna was discussed related to his diabetes He has had weight loss He  was reported to be 165 lbs but was 220 lbs at 5'8'  Stephen [name] reports having to complete most of theIADLs (instrumental Activities of Daily Living) for her an Stephen [name] (includes lawn care that she was doing prior to returning a call to [facility] RN CM)  r   ConditionsDiabetes (DM) type29 HgA1c reported as 6.7),Hypertension (HTN), glaucoma, poor vision in right eye, dementia, coronary atherosclerosis,Chronic obstructive pulmonary disease (COPD)mixed, dyspnea, back pain, asbestos exposure,GERD (gastroesophageal reflux disease), non allergic vasomotor rhinitis, history sinus infection (winter use of antibiotics)  Appointments September 2 Labs & 9 to see MD -PCP office visit  Plans  [facility] RN CM to follow up with pt/wife within Stephen next 14-21 business days to follow up on PCP visit, pharmacy referral, SW referral (12/08/19 for low income housing & snf options)  Routed note to MD Pt encouraged to return a call to [facility] RN CM prn  Goals Addressed              This Visit's Progress     Patient Stated   .  Patient/Family will be able to manage DM, COPD at home (pt-stated)   On track     CARE PLAN ENTRY (see longtitudinal plan of care for additional care plan information)  Objective:  Lab Results  Component Value Date   HGBA1C 7.5 02/18/2017 .   Lab Results  Component Value Date   CREATININE 1.42 (H) 06/22/2019   CREATININE 1.32 09/17/2013   CREATININE 1.16 10/12/2009 .   Marland Kitchen No results found for: EGFR  Current Barriers:  Marland Kitchen Knowledge Deficits related to basic Diabetes pathophysiology and self care/management . Difficulty obtaining or cannot afford medications . Cognitive Deficits  Case Manager Clinical Goal(s):  Over Stephen next 90 days, patient/wife will demonstrate improved adherence to prescribed treatment plan for diabetes self care/management as evidenced by: cbg range 90-150  over Stephen next 60 days pt/wife will receive medication assistance from MD pharmacy staff or [facility]  pharmacy staff as verbalized during outreach  . Verbalize adherence to prescribed medication regimen within Stephen next 31 days  . 12/08/19 reports continued medication cost concerns but with some assist, upcoming 12/10/19 PCP appointment, inquired about resources for housing, lower level of care resources  Interventions:  . Reviewed medications with patient and discussed importance of medication adherence . Discussed plans with patient for ongoing care management follow up and provided patient with direct contact information for care management team . Reviewed scheduled/upcoming provider appointments including: pcp , specialists . Discussed [name] pharmacy services and offered a referral  . Updated her on Stephen pcp pharmacy services . Outreach to pcp office to discuss wife concern for medication assistance for patient's insulin (Amy) Jenel Lucks message to [facility] . Assessed for pharmacy outreach . [facility] RN CM discussed non milk based nutritional drinks like ensure clear, premier, etc  She was cautioned on use of regular gatorade vs zero gatorade related to sugar intake  .  Reviewed differences in SNF vs ALF . Referral to [facility] SW for low income housing, snf locally per wife agreement  Patient Self Care Activities:  . Self administers insulin as prescribed . Attends all scheduled provider appointments . Checks blood sugars as prescribed and utilize hyper and hypoglycemia protocol as needed . Adheres to prescribed ADA/carb modified  Please see past updates related to this goal by clicking on Stephen "Past Updates" button in Stephen selected goal  Please see other previous [facility] care plan information listed in Epic under Stephen flow sheet section          Nasreen Goedecke L. Noelle Penner, RN, BSN, CCM [facility] Telephonic Care Management Care Coordinator Office number [phone] Main [facility] number [phone] Fax number [phone]

## 2019-12-10 DIAGNOSIS — D696 Thrombocytopenia, unspecified: Secondary | ICD-10-CM | POA: Diagnosis not present

## 2019-12-10 DIAGNOSIS — E782 Mixed hyperlipidemia: Secondary | ICD-10-CM | POA: Diagnosis not present

## 2019-12-10 DIAGNOSIS — H4010X0 Unspecified open-angle glaucoma, stage unspecified: Secondary | ICD-10-CM | POA: Diagnosis not present

## 2019-12-10 DIAGNOSIS — J449 Chronic obstructive pulmonary disease, unspecified: Secondary | ICD-10-CM | POA: Diagnosis not present

## 2019-12-10 DIAGNOSIS — Z0001 Encounter for general adult medical examination with abnormal findings: Secondary | ICD-10-CM | POA: Diagnosis not present

## 2019-12-10 DIAGNOSIS — E1122 Type 2 diabetes mellitus with diabetic chronic kidney disease: Secondary | ICD-10-CM | POA: Diagnosis not present

## 2019-12-10 DIAGNOSIS — R945 Abnormal results of liver function studies: Secondary | ICD-10-CM | POA: Diagnosis not present

## 2019-12-15 ENCOUNTER — Other Ambulatory Visit: Payer: Self-pay | Admitting: *Deleted

## 2019-12-15 NOTE — Patient Outreach (Signed)
[facility]) Care Management  12/15/2019  [place] 1932-07-11 [id number] LATE ENTRY  CSW received referral for assistance with housing, financial needs and care/support.  CSW spoke with pt and confirmed his identity. Pt gave CSW permission to speak to his wife who reports she and her husband are struggling financially.  Per wife, they need assistance with the cost of their RX's.  CSW will ask [place] team to speak with her for assistance.  CSW discussed possible assistance through the HOPES program.  CSW provided the # to call to pt's wife and will also mail them info on this.   Pt's wife is wanting to seek some help with caring for her husband but understands this is likely not available due to waiting list and limited programs offering such.  "The biggest need is getting some help with the medicines". Per wife, the "insulin pins and inhalers" are the most costly.  CSW will place referral to [place] and plan to follow up with wife next week.    Eduard Clos, MSW, [place] Worker  [place] [phone]

## 2019-12-15 NOTE — Patient Outreach (Signed)
Referral from Eduard Clos, LCSW to [email or web address] for medication Assistance.

## 2019-12-22 ENCOUNTER — Other Ambulatory Visit: Payer: Self-pay | Admitting: *Deleted

## 2019-12-22 ENCOUNTER — Other Ambulatory Visit: Payer: Self-pay

## 2019-12-22 NOTE — Patient Outreach (Signed)
[facility]) Care Management  12/22/2019  [place]. 12-Feb-1933 [id number]   CSW spoke to pt's wife and she advised she has completed the paperwork for the medication assistance.  "After getting the run around I just called myself".  CSW inquired if they had received the info mailed for the HOPES program for assistance with bills.  "We got all that covered. Just need help with the expense of all the medicines".  CSW will mail resources for them for food pantry/sites and the [facility] program to help with selecting best Medicare plan for them.    Eduard Clos, MSW, [place] Worker  [place] [phone]

## 2019-12-22 NOTE — Patient Outreach (Signed)
[facility]) Care Management  12/22/2019  Erric Balsiger. 1932-10-27 [id number]   [facility] Outreach for [facility] referred patient- complex care   Referral Date:06/25/19 Referral Source:united healthcare medicareAntoinette [facility]  Initial Referral Reason:UHC phone [phone] speak with wife [name]. Member has dementia, wife states member has dementia but does not see a neurology or take any medications for it. She states some days he does good and some days he doesn't remember who she is. She mentions he is getting weaker but uses a walker. He has COPD, diabetes and worsening vision due to diabetes She states today his pcp changed Symbicort to Trelegy which she know is expensive. She also states his rhopressa eyedrops, tresiba and Humalog are expensive He has 2 other eye drips and a nebulizer that are reasonable. She states she metwith a Clinical research associate to file bankruptcy and needs all the help she can get. She tried to get member a dexcom but was told medicare would pay for it but UHC 20% wife currently drives but states she has heart issues and sometime is unable to drive She may need transportation information She is afraid to leave him alone so she cannot go anywhere She would like to have some one to sit with him weekly so she can go to church or go to her appointments  Insurance:united healthcare medicare   Follow up   Mrs Lavallee is able to verify HIPAA [facility] Portability and Accountability Act) identifiers Reviewed and addressed the purpose of the follow up call with the patient  Consent: [facility] ([facility] Customer service manager) RN CM reviewed [facility] services with patient. Patient gave verbal consent for services.  Mrs Geltz reports Mr Perlick is doing well She reports no diarrhea, pain and decreased cbgs She reports she continues to collaborate with [facility], pcp and patient assistance staff for medication patient assistance for Toujeo She reports frequent trips  to the pcp office to get items faxed to the patient assistance program and calls to pcp and patient assistance office to confirm fax items are received. She reports the last Toujeo pen was completed but they have not been informed if assistance will be provided by the drug company To prevent the patient from missing insulin doses the wife brought an out of pocket dose of tresiba from her local pharmacy She reports this has assisted in keeping the cbgs within normal limits She is also working with Dr Craige Cotta with a patient assistance program also Mrs Oelke was provided empathy, support and answered questions for her  [facility] is her pharmacy of choice and [facility] central pharmacy will not be able to assist   She confirms outreach for [facility] to assist with financial resources She reports she need to return a call to [facility]  [facility] RN CM discussed SHIIP, Administrator, Civil Service (DPR) forms and local pantries  [facility] RN CM updated [facility] SW about other concerns voiced by patient's wife today   Plans  [facility] RN CM to follow up with pt/wife within the next 30 business days to follow up Routed note to MD Pt encouraged to return a call to [facility] RN CM prn Goals Addressed              This Visit's Progress     Patient Stated   .  Patient/Family will be able to manage DM, COPD at home (pt-stated)   On track     CARE PLAN ENTRY (see longitudinal plan of care for additional care plan information)  Objective:  Lab Results  Component Value Date   HGBA1C 7.5 02/18/2017 .  12/22/19 updated [facility] RN CM that pt HgA1c is 6.7 per pcp office lab  Lab Results  Component Value Date   CREATININE 1.42 (H) 06/22/2019   CREATININE 1.32 09/17/2013   CREATININE 1.16 10/12/2009 .   Marland Kitchen No results found for: EGFR  Current Barriers:  Marland Kitchen Knowledge Deficits related to basic Diabetes pathophysiology and self care/management . Difficulty obtaining or cannot afford medications . Cognitive Deficits  Case Manager Clinical Goal(s):   Over the next 90 days, patient/wife will demonstrate improved adherence to prescribed treatment plan for diabetes self care/management as evidenced by: cbg range 90-150  over the next 60 days pt/wife will receive medication assistance from MD pharmacy staff or [facility] pharmacy staff as verbalized during outreach  . Verbalize adherence to prescribed medication regimen within the next 31 days  . 12/22/19 reports continued medication cost concerns but continues to work with pcp and [facility] pharmacy, I . 12/22/19 continues to work with [facility] SW for resources for housing, lower level of care resources- Mentioned possible need for food resources  Interventions:  . Reviewed medications with patient and discussed importance of medication adherence . Discussed plans with patient for ongoing care management follow up and provided patient with direct contact information for care management team . Reviewed scheduled/upcoming provider appointments including: pcp , specialists . Discussed [facility] pharmacy services and offered a referral  . Updated her on the pcp pharmacy services . Outreach to pcp office to discuss wife concern for medication assistance for patient's insulin (Amy) Jenel Lucks message to [facility] . Assessed for pharmacy outreach . [facility] RN CM discussed non milk based nutritional drinks like ensure clear, premier, etc  She was cautioned on use of regular Gatorade vs zero Gatorade related to sugar intake  . Reviewed differences in SNF vs ALF . Referral to [facility] for low income housing, snf locally per wife agreement  . Update to [facility] SW via Epic in basket 12/22/19 . Answered questions discussed SHIIP, DPR, food pantries  Patient Self Care Activities:  . Self administers insulin as prescribed . Attends all scheduled provider appointments . Checks blood sugars as prescribed and utilize hyper and hypoglycemia protocol as needed . Adheres to prescribed ADA/carb modified  Please see past updates related to this  goal by clicking on the "Past Updates" button in the selected goal  Please see other previous [facility] care plan information listed in Epic under the flow sheet section         Nuno Brubacher L. Noelle Penner, RN, BSN, CCM [facility] Telephonic Care Management Care Coordinator Office number [phone] Main [facility] number [phone] Fax number [phone]

## 2019-12-24 DIAGNOSIS — H401122 Primary open-angle glaucoma, left eye, moderate stage: Secondary | ICD-10-CM | POA: Diagnosis not present

## 2019-12-24 DIAGNOSIS — H01001 Unspecified blepharitis right upper eyelid: Secondary | ICD-10-CM | POA: Diagnosis not present

## 2019-12-24 DIAGNOSIS — H401113 Primary open-angle glaucoma, right eye, severe stage: Secondary | ICD-10-CM | POA: Diagnosis not present

## 2019-12-24 DIAGNOSIS — J449 Chronic obstructive pulmonary disease, unspecified: Secondary | ICD-10-CM | POA: Diagnosis not present

## 2019-12-24 DIAGNOSIS — H01002 Unspecified blepharitis right lower eyelid: Secondary | ICD-10-CM | POA: Diagnosis not present

## 2020-01-04 ENCOUNTER — Other Ambulatory Visit: Payer: Self-pay | Admitting: *Deleted

## 2020-01-04 NOTE — Patient Outreach (Signed)
[facility]) Care Management  01/04/2020  Stephen [name]. 19-Jun-1932 [id number]   CSW spoke with pt's wife who reports they are doing ok. "we got the assistance with the medicines and that's the most important thing. Pt's wife received and reviewed the info mailed for financial assistance through the HOPES program.  "I don't want to get involved with the government".  She is ok with paying bills and is pleased that the medications are worked out".  Pt's wife agrees to call if needs arise and agreeable to [place] signing off.   CSW will advise PCP and [facility] team of above.   Eduard Clos, MSW, [place] Worker  [place] [phone]

## 2020-01-19 DIAGNOSIS — Z0001 Encounter for general adult medical examination with abnormal findings: Secondary | ICD-10-CM | POA: Diagnosis not present

## 2020-01-19 DIAGNOSIS — E782 Mixed hyperlipidemia: Secondary | ICD-10-CM | POA: Diagnosis not present

## 2020-01-19 DIAGNOSIS — J449 Chronic obstructive pulmonary disease, unspecified: Secondary | ICD-10-CM | POA: Diagnosis not present

## 2020-01-21 ENCOUNTER — Other Ambulatory Visit: Payer: Self-pay | Admitting: *Deleted

## 2020-01-21 ENCOUNTER — Encounter: Payer: Self-pay | Admitting: *Deleted

## 2020-01-21 ENCOUNTER — Other Ambulatory Visit: Payer: Self-pay

## 2020-01-21 NOTE — Patient Outreach (Signed)
[facility]) Care Management  01/21/2020  Jolin Milbert. [birth year] [id number]   [facility] Outreach for [facility] referred patient- complex care  Referral Date:06/25/19 Referral Source:united healthcare medicareAntoinette Carrolyn Meiers Reason:UHC phone [phone] speak with wife [name]. Member has dementia, wife states member has dementia but does not see a neurology or take any medications for it. She states some days he does good and some days he doesn't remember who she is. She mentions he is getting weaker but uses a walker. He has COPD, diabetes and worsening vision due to diabetes She states today his pcp changed Symbicort to Trelegy which she know is expensive. She also states his rhopressa eyedrops, tresiba and Humalog are expensive He has 2 other eye drips and a nebulizer that are reasonable. She states she metwith a Clinical research associate to file bankruptcy and needs all the help she can get. She tried to get member a dexcom but was told medicare would pay for it but U[employer] 20% wife currently drives but states she has heart issues and sometime is unable to drive She may need transportation information She is afraid to leave him alone so she cannot go anywhere She would like to have some one to sit with him weekly so she can go to church or go to her appointments  Insurance:united healthcare medicare  Patient's wife is able to verify HIPAA [facility] Portability and Accountability Act) identifiers Reviewed and addressed the purpose of the follow up call   Consent: [facility] ([facility] Customer service manager) RN CM reviewed [facility] services with patient. Patient gave verbal consent for services.  Follow up  Mediation assistance resolved Mrs Kinslow confirms all medication assistance issues are now resolved as an upstream staff, Sheralyn Boatman has assisted her in Dr Scharlene Gloss office He now has enough Humalog and Toujeo until January 2022  Memory/activity She reports Mr Seurer is doing  good He has "his good days and bad days" with memory and ambulation She report she at times calls her Alvino Chapel or Harriett Sine. She re orients him. She reports she and he went out walking today in the stores for activity. [facility] RN CM discussed the local senior center and having MD to order home health [facility]) Physical therapy (PT) prn. She voices understanding and reports she may discuss this on upcoming MD visit. She is familiar with the [facility] and senior centers Mr Talati likes to play bingo at the senior center.   Diabetes She reports his diabetes is now controlled after the medication assistance resolved His cbg values has been  78- 82 with cbg value not below 70 last HgA1c was 6.7 on 08/20/19  Preventive care He has been to his pcp and has a follow appointment. He has received his flu shot and is pending getting the moderna covid booster when available  Mychart and appointment setup [facility] RN CM assisted with set up for text messages to Mrs Lawhorn mobile phone   [facility] RN CM will follow up with Mr &Mrs Kohut within the next 60-75 business days Pt encouraged to return a call to [facility] RN CM prn Routed note to MD  Goals      Patient Stated   .  [facility]) Find Help in My Community (pt-stated)      Follow Up Date 03/22/20   - follow-up on any referrals for help I am given - make a list of family or friends that I can call    Why is this important?   Knowing how  and where to find help for yourself or family in your neighborhood and community is an important skill.  You will want to take some steps to learn how.    Notes: [facility] RN CM discussed the local senior center and having MD to order home health [facility]) Physical therapy (PT) prn. She voices understanding and reports she may discuss this on upcoming MD visit. She is familiar with the [facility] and senior centers Mr Marchant likes to play bingo at the senior center. [facility] SW provided some resources to follow up with     .  [facility]) Matintain My Quality of Life (pt-stated)        Follow Up Date 03/22/20   - check out options for in-home help, long-term care or hospice - discuss my treatment options with the doctor or nurse - do one enjoyable thing every day - make shared treatment decisions with doctor    Why is this important?   Having a long-term illness can be scary.  It can also be stressful for you and your caregiver.  These steps may help.    Notes:     .  ([facility]N) Prevent Falls (pt-stated)      Follow Up Date 03/22/20  - always wear shoes or slippers with non-slip sole - pick up clutter from the floors - use a cane or walker    Why is this important?   There may be trouble with balance and getting around. Falls can happen.    Notes:     .  [facility]) Track and Manage My Triggers (pt-stated)      Follow Up Date 03/22/20   - identify and remove indoor air pollutants - limit outdoor activity during cold weather - listen for public air quality announcements every day    Why is this important?   Triggers are activities or things, like tobacco smoke or cold weather, that make your COPD (chronic obstructive pulmonary disease) flare-up.  Knowing these triggers helps you plan how to stay away from them.  When you cannot remove them, you can learn how to manage them.     Notes:     .  Patient/Family will be able to manage DM, COPD at home (pt-stated)      CARE PLAN ENTRY (see longitudinal plan of care for additional care plan information)  Objective:  Lab Results  Component Value Date   HGBA1C 7.5 02/18/2017 .  12/22/19 updated [facility] RN CM that pt HgA1c is 6.7 per pcp office lab  Lab Results  Component Value Date   CREATININE 1.42 (H) 06/22/2019   CREATININE 1.32 09/17/2013   CREATININE 1.16 10/12/2009 .   Marland Kitchen No results found for: EGFR  Current Barriers:  Marland Kitchen Knowledge Deficits related to basic Diabetes pathophysiology and self care/management . Difficulty obtaining or cannot afford medications . Cognitive Deficits  Case Manager Clinical  Goal(s):  Over the next 90 days, patient/wife will demonstrate improved adherence to prescribed treatment plan for diabetes self care/management as evidenced by: cbg range 90-150  over the next 60 days pt/wife will receive medication assistance from MD pharmacy staff or [facility] pharmacy staff as verbalized during outreach  . Verbalize adherence to prescribed medication regimen within the next 31 days  . 12/22/19 reports continued medication cost concerns but continues to work with pcp and upstream pharmacy, I . 12/22/19 continues to work with [facility] SW for resources for housing, lower level of care resources- Mentioned possible need for food resources . 01/21/20 medication assistance issues  resolved [facility] SW services signed off on 01/04/20 Resolving due to duplicate goal  .   Interventions:  . Reviewed medications with patient and discussed importance of medication adherence . Discussed plans with patient for ongoing care management follow up and provided patient with direct contact information for care management team . Reviewed scheduled/upcoming provider appointments including: pcp , specialists . Discussed [facility] pharmacy services and offered a referral  . Updated her on the pcp pharmacy services . Outreach to pcp office to discuss wife concern for medication assistance for patient's insulin (Amy) Jenel Lucks message to upstream . Assessed for pharmacy outreach . [facility] RN CM discussed non milk based nutritional drinks like ensure clear, premier, etc  She was cautioned on use of regular Gatorade vs zero Gatorade related to sugar intake  . Reviewed differences in SNF vs ALF . Referral to [facility] SW for low income housing, snf locally per wife agreement  . Update to [facility] SW via Epic in basket 12/22/19 . Answered questions discussed SHIIP, DPR, food pantries  Patient Self Care Activities:  . Self administers insulin as prescribed . Attends all scheduled provider appointments . Checks blood sugars as prescribed and  utilize hyper and hypoglycemia protocol as needed . Adheres to prescribed ADA/carb modified  Please see past updates related to this goal by clicking on the "Past Updates" button in the selected goal  Please see other previous [facility] care plan information listed in Epic under the flow sheet section        Venisa Frampton L. Noelle Penner, RN, BSN, CCM [facility] Telephonic Care Management Care Coordinator Office number [phone] Main [facility] number [phone] Fax number [phone]

## 2020-02-12 DIAGNOSIS — D696 Thrombocytopenia, unspecified: Secondary | ICD-10-CM | POA: Diagnosis not present

## 2020-02-12 DIAGNOSIS — E782 Mixed hyperlipidemia: Secondary | ICD-10-CM | POA: Diagnosis not present

## 2020-02-12 DIAGNOSIS — E1122 Type 2 diabetes mellitus with diabetic chronic kidney disease: Secondary | ICD-10-CM | POA: Diagnosis not present

## 2020-02-12 DIAGNOSIS — J449 Chronic obstructive pulmonary disease, unspecified: Secondary | ICD-10-CM | POA: Diagnosis not present

## 2020-02-12 DIAGNOSIS — N183 Chronic kidney disease, stage 3 unspecified: Secondary | ICD-10-CM | POA: Diagnosis not present

## 2020-02-16 DIAGNOSIS — J069 Acute upper respiratory infection, unspecified: Secondary | ICD-10-CM | POA: Diagnosis not present

## 2020-03-18 DIAGNOSIS — R945 Abnormal results of liver function studies: Secondary | ICD-10-CM | POA: Diagnosis not present

## 2020-03-18 DIAGNOSIS — E1122 Type 2 diabetes mellitus with diabetic chronic kidney disease: Secondary | ICD-10-CM | POA: Diagnosis not present

## 2020-03-18 DIAGNOSIS — L820 Inflamed seborrheic keratosis: Secondary | ICD-10-CM | POA: Diagnosis not present

## 2020-03-18 DIAGNOSIS — R600 Localized edema: Secondary | ICD-10-CM | POA: Diagnosis not present

## 2020-03-22 ENCOUNTER — Other Ambulatory Visit: Payer: Self-pay | Admitting: *Deleted

## 2020-03-22 NOTE — Patient Outreach (Signed)
[facility]) Care Management  03/22/2020  Stephen [name]. 1932-04-20 [id number]   [facility] Unsuccessful outreach to [facility] complex care patient  Stephen [name] was referred to [facility] on 06/25/19 from referral source united healthcare medicare staff Stephen [name] Reason:UHC phone [phone] speak with wife Stephen [name]. Member has dementia, wife states member has dementia but does not see a neurology or take any medications for it. She states some days he does good and some days he doesn't remember who she is. She mentions he is getting weaker but uses a walker. He has COPD, diabetes and worsening vision due to diabetes She states today his pcp changed Symbicort to Trelegy which she know is expensive. She also states his rhopressa eyedrops, tresiba and Humalog are expensive He has 2 other eye drips and a nebulizer that are reasonable. She states she metwith a Chief Executive Officer to file bankruptcy and needs all the help she can get. She tried to get member a dexcom but was told medicare would pay for it but U[employer] 20% wife currently drives but states she has heart issues and sometime is unable to drive She may need transportation information She is afraid to leave him alone so she cannot go anywhere She would like to have some one to sit with him weekly so she can go to church or go to her appointments  Insurance:united healthcare medicare  Outreach attempt to the home number  No answer. [facility] RN CM left HIPAA [facility] Portability and Accountability Act) compliant voicemail message along with CM's contact info. Wished Stephen [name] a happy belated birthday  Plan: [facility] RN CM scheduled this patient for another call attempt within 4-7 business days  Stephen [name] L. Stephen Hamman, RN, BSN, [place] Coordinator Office number [phone] Mobile number [phone]  Main [facility] number [phone] Fax number [phone]

## 2020-03-23 ENCOUNTER — Ambulatory Visit (HOSPITAL_COMMUNITY)
Admission: RE | Admit: 2020-03-23 | Discharge: 2020-03-23 | Disposition: A | Discharge status: Home / Self Care | Payer: Medicare Other | Source: Ambulatory Visit | Attending: Internal Medicine | Admitting: Internal Medicine

## 2020-03-23 ENCOUNTER — Other Ambulatory Visit (HOSPITAL_COMMUNITY): Payer: Self-pay | Admitting: Internal Medicine

## 2020-03-23 ENCOUNTER — Other Ambulatory Visit: Payer: Self-pay

## 2020-03-23 DIAGNOSIS — M25511 Pain in right shoulder: Secondary | ICD-10-CM | POA: Insufficient documentation

## 2020-03-23 DIAGNOSIS — J449 Chronic obstructive pulmonary disease, unspecified: Secondary | ICD-10-CM | POA: Diagnosis not present

## 2020-03-23 DIAGNOSIS — W19XXXA Unspecified fall, initial encounter: Secondary | ICD-10-CM | POA: Diagnosis not present

## 2020-03-23 DIAGNOSIS — E782 Mixed hyperlipidemia: Secondary | ICD-10-CM | POA: Diagnosis not present

## 2020-03-23 DIAGNOSIS — D696 Thrombocytopenia, unspecified: Secondary | ICD-10-CM | POA: Diagnosis not present

## 2020-03-23 DIAGNOSIS — E1122 Type 2 diabetes mellitus with diabetic chronic kidney disease: Secondary | ICD-10-CM | POA: Diagnosis not present

## 2020-03-23 DIAGNOSIS — R945 Abnormal results of liver function studies: Secondary | ICD-10-CM | POA: Diagnosis not present

## 2020-03-29 ENCOUNTER — Other Ambulatory Visit: Payer: Self-pay | Admitting: *Deleted

## 2020-03-29 NOTE — Patient Outreach (Signed)
[facility] ([facility]) Care Management  03/29/2020  Stephen V Milosevic Jr. 04/10/1932 [id number]   [facility] Unsuccessful outreach to [facility] complex care patient  Stephen [name] was referred to [facility] on 06/25/19 from referral source united healthcare medicare staff A Bellamy  InitialReferral Reason:UHC phone [phone] speak with wife Kathleen. Member has dementia, wife states member has dementia but does not see a neurology or take any medications for it. She states some days he does good and some days he doesn't remember who she is. She mentions he is getting weaker but uses a walker. He has COPD, diabetes and worsening vision due to diabetes She states today his pcp changed Symbicort to Trelegy which she know is expensive. She also states his rhopressa eyedrops, tresiba and Humalog are expensive He has 2 other eye drips and a nebulizer that are reasonable. She states she metwith a lawyer to file bankruptcy and needs all the help she can get. She tried to get member a dexcom but was told medicare would pay for it but U[employer] 20% wife currently drives but states she has heart issues and sometime is unable to drive She may need transportation information She is afraid to leave him alone so she cannot go anywhere She would like to have some one to sit with him weekly so she can go to church or go to her appointments  Insurance:united healthcare medicare   [facility] RN CM received a message from wife, Kathleen reporting she and Stephen [name] was fine  Outreach attempt to the home number  No answer. [facility] RN CM left HIPAA (Health Insurance Portability and Accountability Act) compliant voicemail message along with CM's contact info.    Plan: [facility] RN CM scheduled this patient for another call attempt within 4-7 business days   L. , RN, BSN, CCM [facility] Telephonic Care Management Care Coordinator Office number [phone] Mobile number [phone]  Main [facility] number [phone] Fax number  [phone]  

## 2020-03-29 NOTE — Patient Outreach (Signed)
[facility]) Care Management  03/29/2020  Elfego Sedor. Mar 19, 1933 [id number]  [facility] successful outreachto [facility] complex care patient  Mr Davidoff was referred to [facility] on 06/25/19 from referral source united healthcare medicare staff A Carrolyn Meiers Reason:UHC phone [phone] speak with wife [name]. Member has dementia, wife states member has dementia but does not see a neurology or take any medications for it. She states some days he does good and some days he doesn't remember who she is. She mentions he is getting weaker but uses a walker. He has COPD, diabetes and worsening vision due to diabetes She states today his pcp changed Symbicort to Trelegy which she know is expensive. She also states his rhopressa eyedrops, tresiba and Humalog are expensive He has 2 other eye drips and a nebulizer that are reasonable. She states she metwith a Clinical research associate to file bankruptcy and needs all the help she can get. She tried to get member a dexcom but was told medicare would pay for it but U[employer] 20% wife currently drives but states she has heart issues and sometime is unable to drive She may need transportation information She is afraid to leave him alone so she cannot go anywhere She would like to have some one to sit with him weekly so she can go to church or go to her appointments  Insurance:united healthcare medicare   Pt and wife called [facility] RN CM back Mr Schrack has dementia Mrs Stupp is able to verify HIPAA [employer] Portability and Accountability Act) identifiers Reviewed and addressed the purpose of the follow up call with the patient  Consent: [facility] ([facility] Customer service manager) RN CM reviewed [facility] services with patient. Patient gave verbal consent for services.  Pt and wife had possible breakthrough covid symptoms for the last 3 week They both have had covid vaccines and boosters  They had telephonic calls to the primary care provider (PCP) office to  report symptoms and were provided treatments of antibiotics and prednisone Symptoms they have had includes increased fatigue, sleeping various times went,  sore throat. Mr Okubo was reported with weaker legs slower mobility and slow in completing dressing and other  Activities of daily living (ADLs) They only recall going out to church events or to family but maintained the covid precautions  Mr Burritt is reported to still have good and bad days related to his memory. He is aware and able to state he can't remember things as much Diabetes continues to be monitored at home increased cbg values plus an increase in HgA1c from 6.9 to 7.8 This also is after doses of prednisone and increase intake of sweets during the holidays beginning with Halloween  No worsening breathing/COPD symptoms  Medications management and cost concerns have all been resolved by Sheralyn Boatman , upstream pharmacy staff Sheralyn Boatman has also signed them up for medication patient assistance for 2022   [facility] RN CM answered and discussed options for local Personal care services   Wife given time to ventilate at intervals   Plan: Patient agrees to care plan and follow up [facility] RN CM scheduled this patient for another call attempt within 90-100 business days Pt encouraged to return a call to [facility] RN CM prn Routed note to MD Goals Addressed              This Visit's Progress     Patient Stated     COMPLETED: [facility]) Find Help in My Community (pt-stated)   On track  Follow Up Date 03/29/20   - follow-up on any referrals for help I am given - make a list of family or friends that I can call     Notes: 03/29/20 now with Devonne Doughty pharmacy services [facility] RN CM discussed the local senior center and having MD to order home health [facility]) Physical therapy (PT) prn. She voices understanding and reports she may discuss this on upcoming MD visit. She is familiar with the [facility] and senior centers Mr Lampman likes to play bingo at the senior center.  [facility] SW provided some resources to follow up with       COMPLETED: [facility]) Manage My Medicine (pt-stated)   On track     Timeframe:  Short-Term Goal Priority:  High Start Date:                01/21/20             Expected End Date:                  03/29/20  Follow Up Date 03/29/20   - call for medicine refill 2 or 3 days before it runs out - use a pillbox to sort medicine    Notes: goal met completed 03/29/20 Medications management and cost concerns have all been resolved by Sheralyn Boatman , upstream pharmacy staff Sheralyn Boatman has also signed them up for medication patient assistance for 2022      [facility]) Maintain My Quality of Life (pt-stated)   On track     Follow Up Date 06/27/20   - check out options for in-home help, long-term care or hospice - discuss my treatment options with the doctor or nurse - do one enjoyable thing every day - make shared treatment decisions with doctor      Notes: 03/29/20 progressing       COMPLETED: ([facility]N) Prevent Falls (pt-stated)   On track     Follow Up Date 03/29/20  - always wear shoes or slippers with non-slip sole - pick up clutter from the floors - use a cane or walker      Notes:  03/29/20 no falls in last 3 months       [facility]) Track and Manage My Triggers (pt-stated)   On track     Follow Up Date 06/27/20   - identify and remove indoor air pollutants - limit outdoor activity during cold weather - listen for public air quality announcements every day      Notes: 03/29/20  possible breakthrough covid symptoms for the last 3 week Had covid vaccines and booster PCP assisted with evaluation and treatment with antibiotics and prednisone      Monitor and Manage My Blood Sugar-Diabetes Type 2 (pt-stated)   Not on track     Timeframe:  Long-Range Goal Priority:  High Start Date:                          03/29/20   Expected End Date:           07/30/20            Follow Up Date 06/30/20   - check blood sugar at prescribed times - check blood sugar if  I feel it is too high or too low   Notes: 03/29/20 HgA1c from 6.9 to 7.8       COMPLETED: Patient/Family will be able to manage DM, COPD at home (pt-stated)   On track  03/29/20 Resolving due to duplicate goal  CARE PLAN ENTRY (see longitudinal plan of care for additional care plan information)  Objective:  Lab Results  Component Value Date   HGBA1C 7.5 02/18/2017   12/22/19 updated [facility] RN CM that pt HgA1c is 6.7 per pcp office lab  Lab Results  Component Value Date   CREATININE 1.42 (H) 06/22/2019   CREATININE 1.32 09/17/2013   CREATININE 1.16 10/12/2009     No results found for: EGFR  Current Barriers:   Knowledge Deficits related to basic Diabetes pathophysiology and self care/management  Difficulty obtaining or cannot afford medications  Cognitive Deficits  Case Manager Clinical Goal(s):  Over the next 90 days, patient/wife will demonstrate improved adherence to prescribed treatment plan for diabetes self care/management as evidenced by: cbg range 90-150  over the next 60 days pt/wife will receive medication assistance from MD pharmacy staff or [facility] pharmacy staff as verbalized during outreach   Verbalize adherence to prescribed medication regimen within the next 31 days   12/22/19 reports continued medication cost concerns but continues to work with pcp and upstream pharmacy, I  12/22/19 continues to work with [facility] SW for resources for housing, lower level of care resources- Mentioned possible need for food resources  01/21/20 medication assistance issues resolved [facility] SW services signed off on 01/04/20 Resolving due to duplicate goal     Interventions:   Reviewed medications with patient and discussed importance of medication adherence  Discussed plans with patient for ongoing care management follow up and provided patient with direct contact information for care management team  Reviewed scheduled/upcoming provider appointments including: pcp ,  specialists  Discussed [facility] pharmacy services and offered a referral   Updated her on the pcp pharmacy services  Outreach to pcp office to discuss wife concern for medication assistance for patient's insulin (Amy) Jenel Lucks message to upstream  Assessed for pharmacy outreach  [facility] RN CM discussed non milk based nutritional drinks like ensure clear, premier, etc  She was cautioned on use of regular Gatorade vs zero Gatorade related to sugar intake   Reviewed differences in SNF vs ALF  Referral to [facility] SW for low income housing, snf locally per wife agreement   Update to [facility] SW via Epic in basket 12/22/19  Answered questions discussed SHIIP, DPR, food pantries  Patient Self Care Activities:  03/29/20 Resolving due to duplicate goal   Self administers insulin as prescribed  Attends all scheduled provider appointments  Checks blood sugars as prescribed and utilize hyper and hypoglycemia protocol as needed  Adheres to prescribed ADA/carb modified  Please see past updates related to this goal by clicking on the "Past Updates" button in the selected goal  Please see other previous [facility] care plan information listed in Epic under the flow sheet section         Felica Chargois L. Noelle Penner, RN, BSN, CCM [facility] Telephonic Care Management Care Coordinator Office number [phone] Main [facility] number [phone] Fax number [phone]

## 2020-03-30 DIAGNOSIS — H01001 Unspecified blepharitis right upper eyelid: Secondary | ICD-10-CM | POA: Diagnosis not present

## 2020-03-30 DIAGNOSIS — H01002 Unspecified blepharitis right lower eyelid: Secondary | ICD-10-CM | POA: Diagnosis not present

## 2020-03-30 DIAGNOSIS — H401122 Primary open-angle glaucoma, left eye, moderate stage: Secondary | ICD-10-CM | POA: Diagnosis not present

## 2020-03-30 DIAGNOSIS — H401113 Primary open-angle glaucoma, right eye, severe stage: Secondary | ICD-10-CM | POA: Diagnosis not present

## 2020-04-01 DIAGNOSIS — E1122 Type 2 diabetes mellitus with diabetic chronic kidney disease: Secondary | ICD-10-CM | POA: Diagnosis not present

## 2020-04-01 DIAGNOSIS — J449 Chronic obstructive pulmonary disease, unspecified: Secondary | ICD-10-CM | POA: Diagnosis not present

## 2020-04-01 DIAGNOSIS — N183 Chronic kidney disease, stage 3 unspecified: Secondary | ICD-10-CM | POA: Diagnosis not present

## 2020-04-01 DIAGNOSIS — D696 Thrombocytopenia, unspecified: Secondary | ICD-10-CM | POA: Diagnosis not present

## 2020-04-01 DIAGNOSIS — E782 Mixed hyperlipidemia: Secondary | ICD-10-CM | POA: Diagnosis not present

## 2020-04-05 ENCOUNTER — Ambulatory Visit: Payer: Self-pay | Admitting: *Deleted

## 2020-04-25 DIAGNOSIS — J449 Chronic obstructive pulmonary disease, unspecified: Secondary | ICD-10-CM | POA: Diagnosis not present

## 2020-04-25 DIAGNOSIS — E782 Mixed hyperlipidemia: Secondary | ICD-10-CM | POA: Diagnosis not present

## 2020-04-25 DIAGNOSIS — I2510 Atherosclerotic heart disease of native coronary artery without angina pectoris: Secondary | ICD-10-CM | POA: Diagnosis not present

## 2020-04-25 DIAGNOSIS — H4010X0 Unspecified open-angle glaucoma, stage unspecified: Secondary | ICD-10-CM | POA: Diagnosis not present

## 2020-04-25 DIAGNOSIS — E113513 Type 2 diabetes mellitus with proliferative diabetic retinopathy with macular edema, bilateral: Secondary | ICD-10-CM | POA: Diagnosis not present

## 2020-04-25 DIAGNOSIS — N1831 Chronic kidney disease, stage 3a: Secondary | ICD-10-CM | POA: Diagnosis not present

## 2020-04-25 DIAGNOSIS — E1122 Type 2 diabetes mellitus with diabetic chronic kidney disease: Secondary | ICD-10-CM | POA: Diagnosis not present

## 2020-04-26 ENCOUNTER — Other Ambulatory Visit: Payer: Self-pay | Admitting: *Deleted

## 2020-04-26 NOTE — Patient Outreach (Signed)
[facility]) Care Management  04/26/2020  Nyshaun Standage. [birth year] [id number]   Sebastian coordination - attempt to respond to a message left   Mr Minichiello was referred to [facility] on 06/25/19 from referral source [employer] medicare staff A Bellamy  Insurance:[employer] medicare   Received a message from wife [name]  [facility] RN CM returned a call  Mr Skidmore has dementia Mrs Hora is generally able to voice his concerns  [facility] Unsuccessful outreach  Outreach attempt to the home number  No answer. [facility] RN CM left HIPAA [facility] Portability and Accountability Act) compliant voicemail message along with CM's contact info.   Plan: [facility] RN CM scheduled this patient for another call attempt within 30 business days  Anorah Trias L. Lavina Hamman, RN, BSN, [place] Coordinator Office number [phone] Mobile number [phone]  Main [facility] number [phone] Fax number [phone]   Plan  [place] Lavina Hamman, RN, BSN, [place] Coordinator Office number [phone] Mobile number [phone]  Main [facility] number [phone] Fax number [phone]

## 2020-04-27 ENCOUNTER — Other Ambulatory Visit: Payer: Self-pay | Admitting: *Deleted

## 2020-04-27 NOTE — Patient Outreach (Signed)
[facility]) Care Management  04/27/2020  Eliyahu Stanforth. 11/01/1932 [id number]  [facility] Care outreach to respond to a message left  Mr Bralley was referred to [facility] on 06/25/19 from referral source [employer] medicare staff A Bellamy  Insurance:[employer] medicare  Patient is able to verify HIPAA [facility] Portability and Accountability Act) identifiers Reviewed and addressed the purpose of the follow up call with the patient  Consent: [facility] ([facility]) RN CM reviewed [facility] services with patient. Patient gave verbal consent for services.   Follow up Received a message from wife [name]  [facility] RN CM returned a call  Mr Serafini has dementia Mrs Eads generally able to voice his concerns  Pt is active for hospice services since 04/25/20 with community home care & hospice- [place] Wife fell on ice about 2 weeks ago going to get the mail from the mail box, witnessed by neighbors  She is the primary care giver for Mr Hartner Wife returns to her orthopedic provider on 05/02/20-  hospice [facility] offered assistance and their daughter is supportive  $2800/month Discussed aging and disability after [name] inquired about "home health services" [facility] RN CM provided education on the differences in home health, personal care and sitters She voiced understanding and states she was interested in having someone to sit with Mr Louvier for a few hours. She reports she was informed the cost could be about "thiry dollars an hour" [facility]  RN CM discussed the general cost for personal care services. She states her daughter may be able to assist with personal care services prn Provided the contact number for [employer] (aging, disability and transit services)- Scherrie Merritts  as [phone]  Offer to complete a conference call with wife to [employer] but she reports she is awaiting a call from a MD and would call later  Got a woman from church to do house  cleaning  Encouraged to call stores to assist with delivery of groceries She is familiar with [employer] delivery of items   Discussed [facility] case closure related to pt now with external care management services from hospice    Plans Case closure as patient reported by wife to be active with Hospice since 04/25/20 Case closure letters to pt/wife and PCP Goals Addressed              This Visit's Progress     Patient Stated   .  COMPLETED: [facility]) Maintain My Quality of Life (pt-stated)   On track     Follow Up Date   - check out options for in-home help, long-term care or hospice - discuss my treatment options with the doctor or nurse - do one enjoyable thing every day - make shared treatment decisions with doctor     Notes: 04/27/20 Case closure as patient reported by wife to be active with Hospice since  03/29/20 progressing     .  COMPLETED: ([facility]) Monitor and Manage My Blood Sugar-Diabetes Type 2 (pt-stated)        Timeframe:  Long-Range Goal Priority:  High Start Date:                          03/29/20   Expected End Date:           07/30/20            Follow Up Date   - check blood sugar at prescribed times - check blood sugar if  I feel it is too high or too low    Notes: 04/27/20 Case closure as patient reported by wife to be active with Hospice since 04/25/2210/28/21 HgA1c from 6.9 to 7.8     .  COMPLETED: [facility]) Track and Manage My Triggers (pt-stated)   On track     Follow Up Date   - identify and remove indoor air pollutants - limit outdoor activity during cold weather - listen for public air quality announcements every day      Notes: 04/27/20 Case closure as patient reported by wife to be active with Hospice since  03/29/20  possible breakthrough covid symptoms for the last 3 week Had covid vaccines and booster PCP assisted with evaluation and treatment with antibiotics and prednisone       Cicily Bonano L. Noelle Penner, RN, BSN, CCM [facility] Telephonic Care Management Care  Coordinator Office number [phone] Main [facility] number [phone] Fax number [phone]

## 2020-04-30 DIAGNOSIS — D696 Thrombocytopenia, unspecified: Secondary | ICD-10-CM | POA: Diagnosis not present

## 2020-04-30 DIAGNOSIS — J449 Chronic obstructive pulmonary disease, unspecified: Secondary | ICD-10-CM | POA: Diagnosis not present

## 2020-04-30 DIAGNOSIS — E1122 Type 2 diabetes mellitus with diabetic chronic kidney disease: Secondary | ICD-10-CM | POA: Diagnosis not present

## 2020-04-30 DIAGNOSIS — R945 Abnormal results of liver function studies: Secondary | ICD-10-CM | POA: Diagnosis not present

## 2020-04-30 DIAGNOSIS — E782 Mixed hyperlipidemia: Secondary | ICD-10-CM | POA: Diagnosis not present

## 2020-05-12 DIAGNOSIS — H01002 Unspecified blepharitis right lower eyelid: Secondary | ICD-10-CM | POA: Diagnosis not present

## 2020-05-12 DIAGNOSIS — H401113 Primary open-angle glaucoma, right eye, severe stage: Secondary | ICD-10-CM | POA: Diagnosis not present

## 2020-05-12 DIAGNOSIS — H01001 Unspecified blepharitis right upper eyelid: Secondary | ICD-10-CM | POA: Diagnosis not present

## 2020-05-12 DIAGNOSIS — H401122 Primary open-angle glaucoma, left eye, moderate stage: Secondary | ICD-10-CM | POA: Diagnosis not present

## 2020-05-19 DIAGNOSIS — R5381 Other malaise: Secondary | ICD-10-CM | POA: Diagnosis not present

## 2020-05-19 DIAGNOSIS — R531 Weakness: Secondary | ICD-10-CM | POA: Diagnosis not present

## 2020-05-22 DIAGNOSIS — J449 Chronic obstructive pulmonary disease, unspecified: Secondary | ICD-10-CM | POA: Diagnosis not present

## 2020-05-31 DEATH — deceased

## 2020-06-27 ENCOUNTER — Ambulatory Visit: Payer: Self-pay | Admitting: *Deleted

## 2020-07-04 IMAGING — DX DG CHEST 2V
2 series · 2 of 2 positions shown · non-contrast
Comparison: Radiographs March 07, 2017.

CLINICAL DATA: Dyspnea on exertion.

EXAM:
CHEST - 2 VIEW

[chest pa]
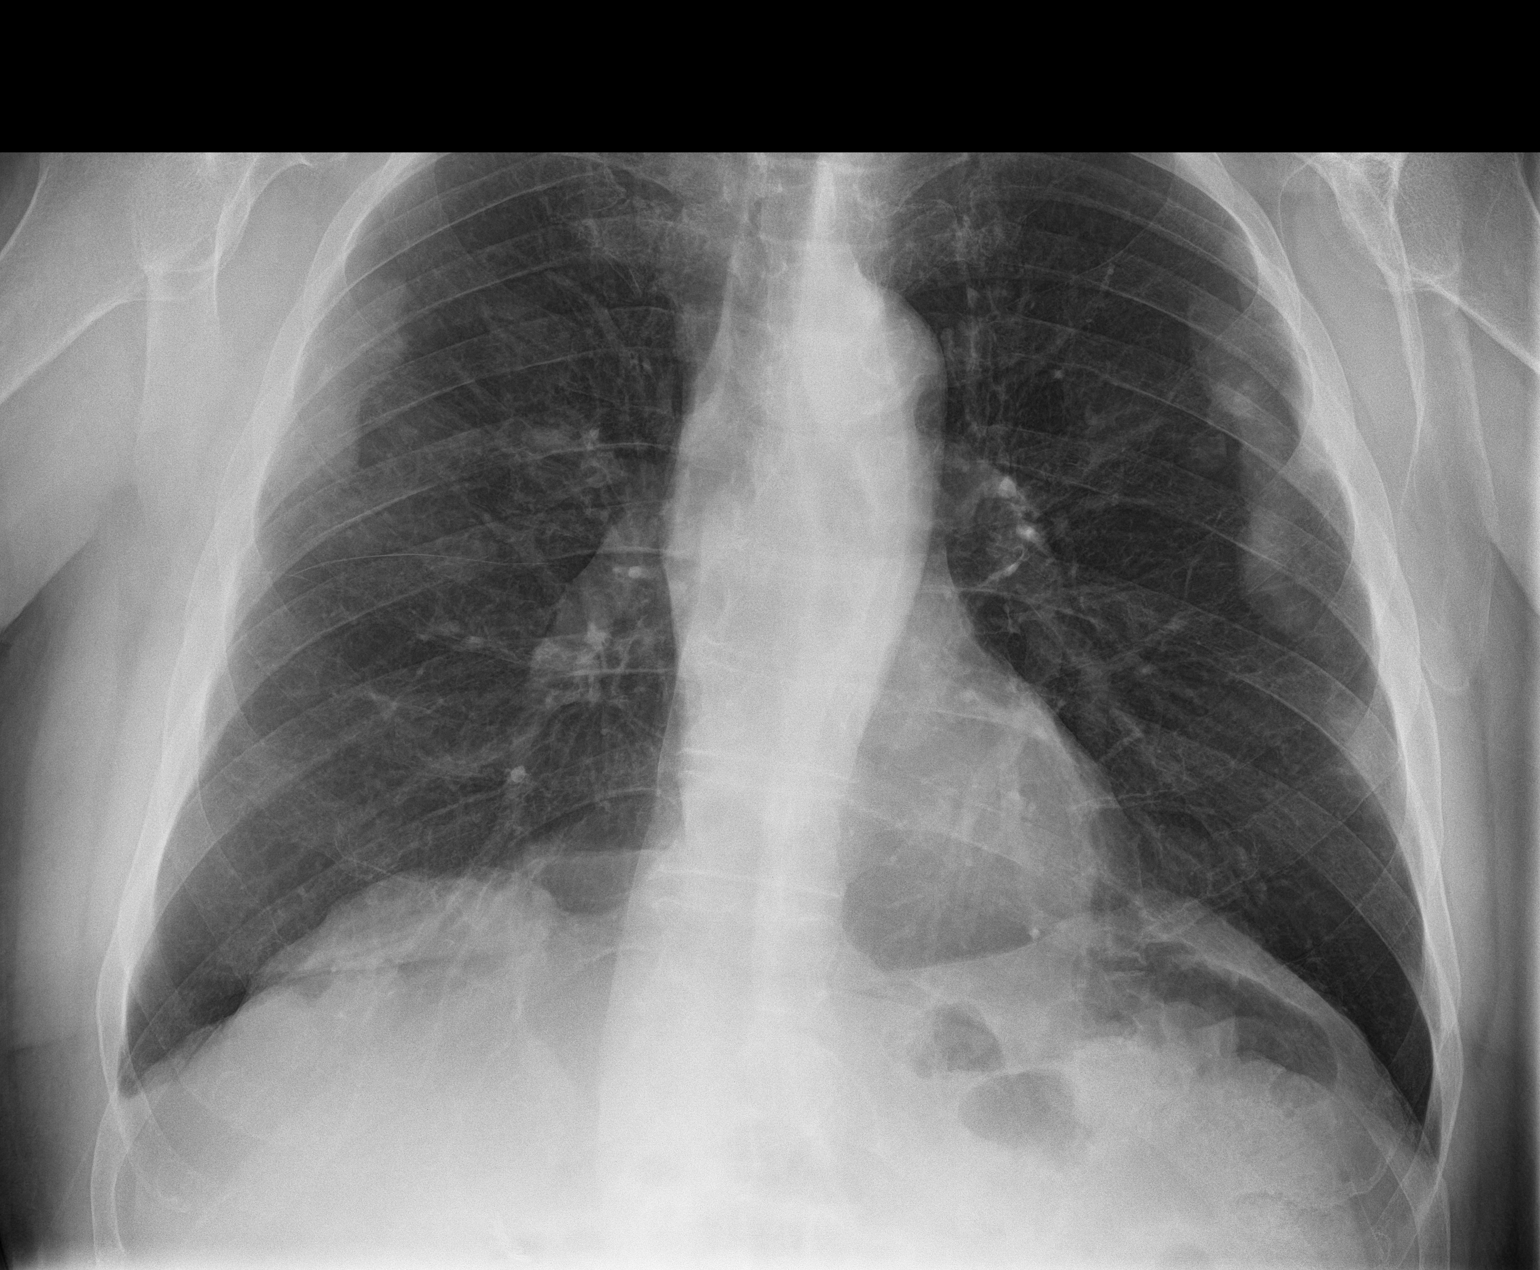

[chest lat]
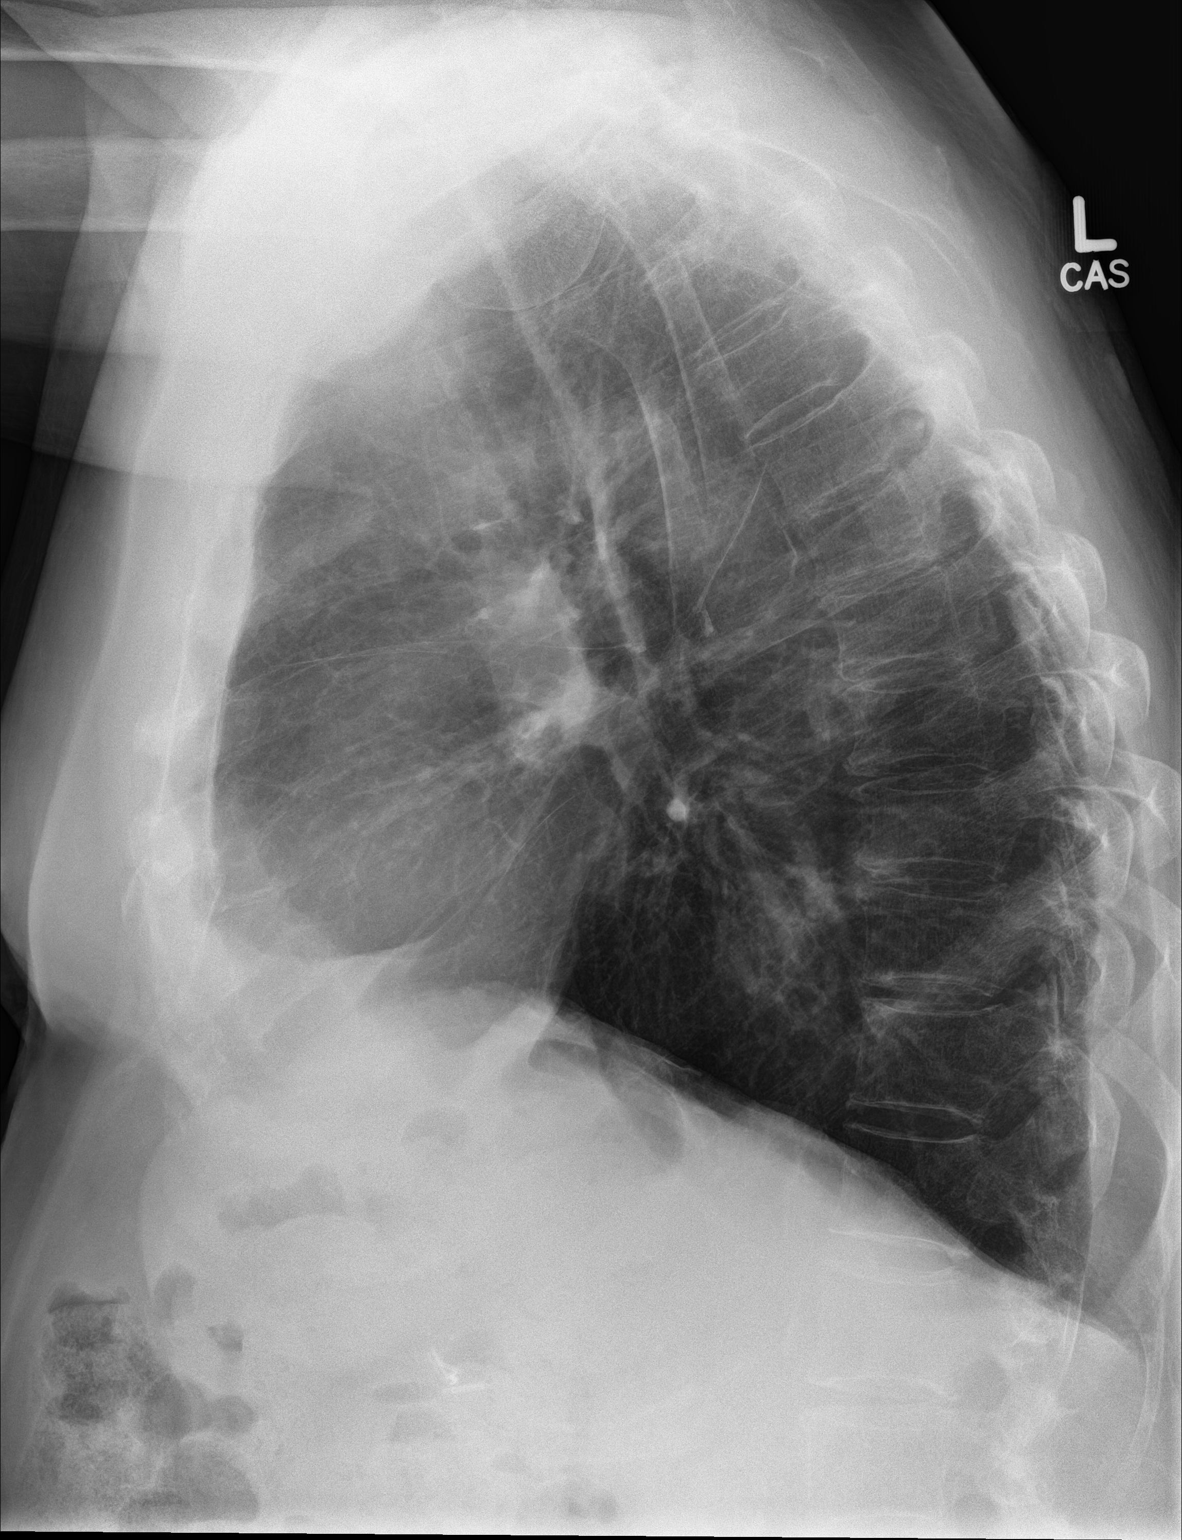

[2 of 2 positions shown; findings below may reference images not displayed]

FINDINGS: The heart size and mediastinal contours are within normal limits. No
pneumothorax or pleural effusion is noted. Stable bilateral
calcified pleural plaques are noted. No acute pulmonary abnormality
is noted. The visualized skeletal structures are unremarkable.
IMPRESSION: No active cardiopulmonary disease. Stable calcified bilateral
pleural plaques.

## 2021-09-15 IMAGING — DX DG CHEST 2V
2 series · 2 of 2 positions shown · non-contrast
Comparison: Chest x-ray 03/11/2018.

CLINICAL DATA: 86-year-old male with history of trauma from a fall
several days ago. Pain down the lateral side of the ribcage.
Shortness of breath.

EXAM:
RIGHT RIBS - 2 VIEW; CHEST - 2 VIEW

[chest pa]
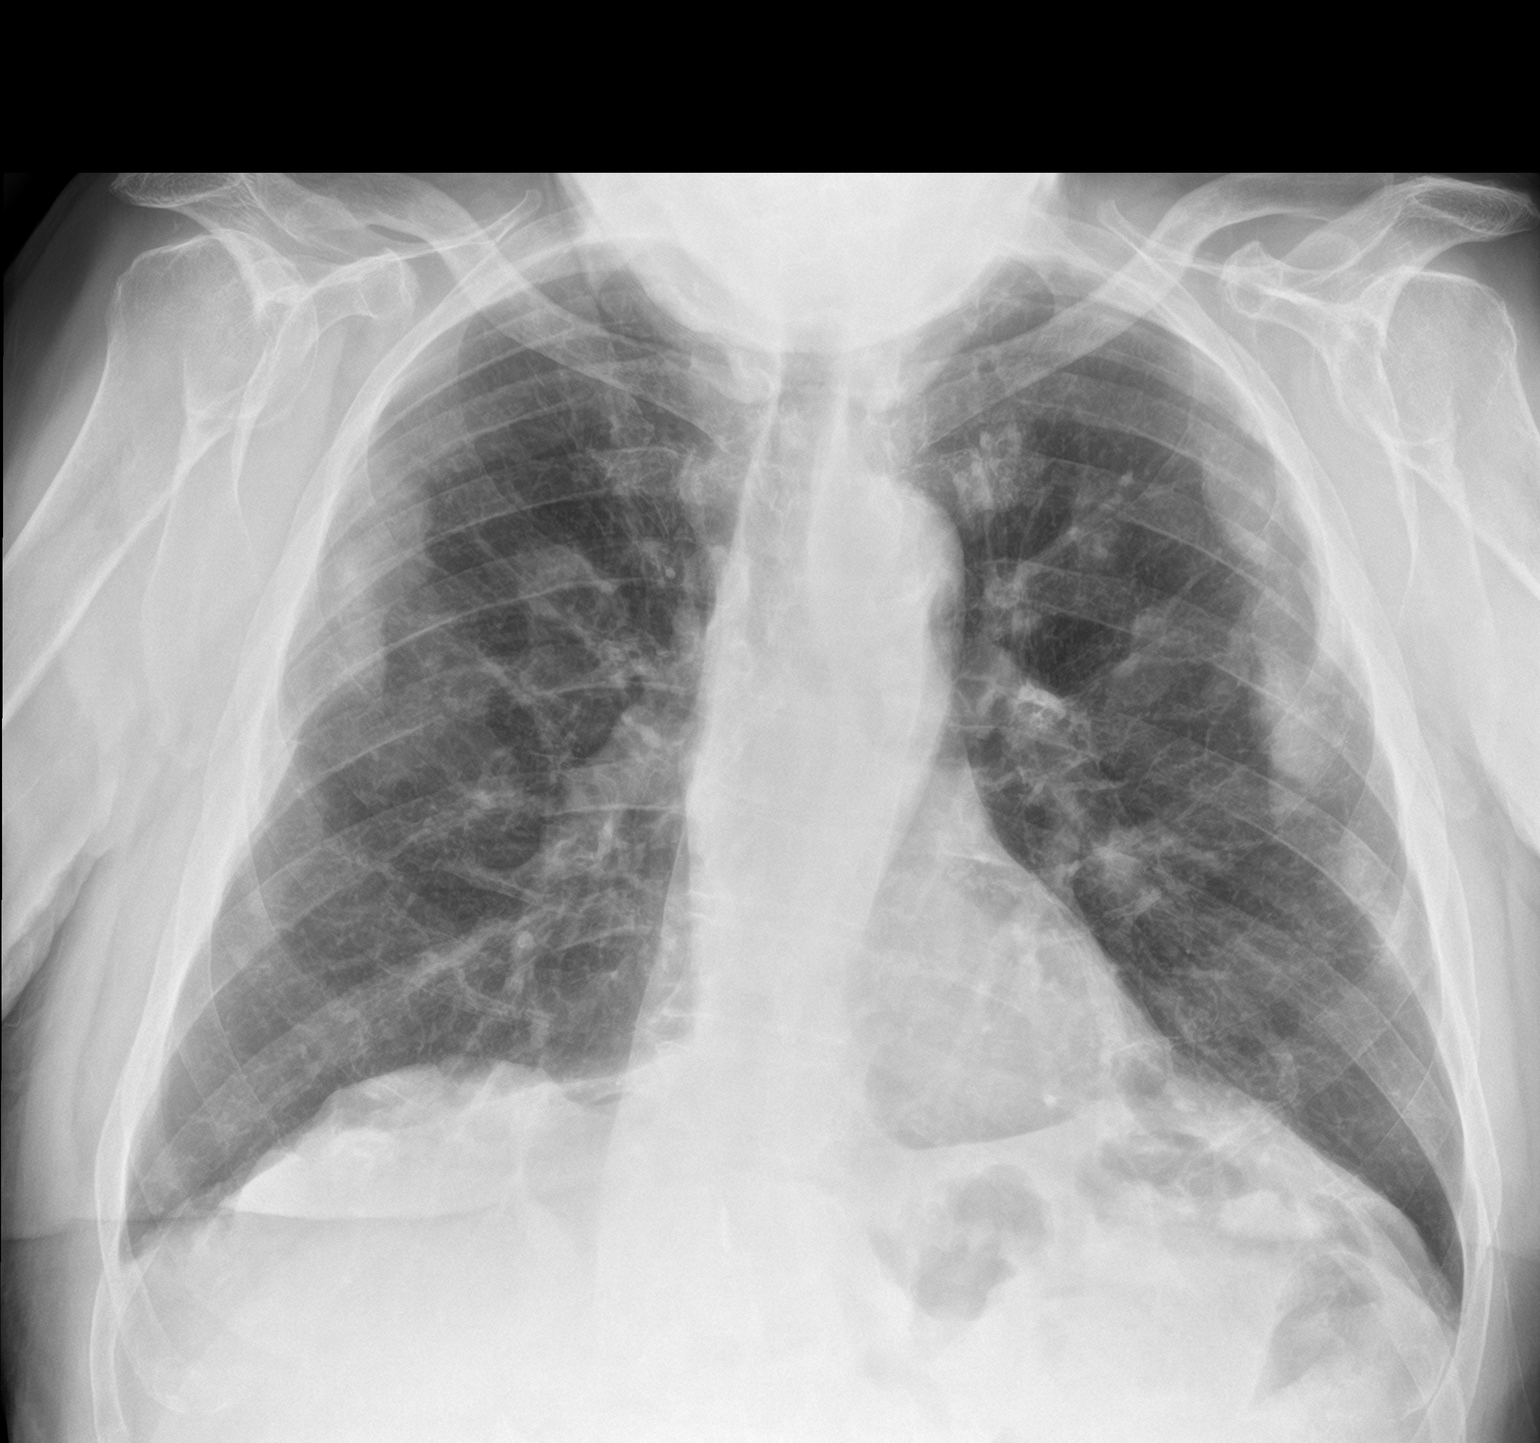

[chest lat]
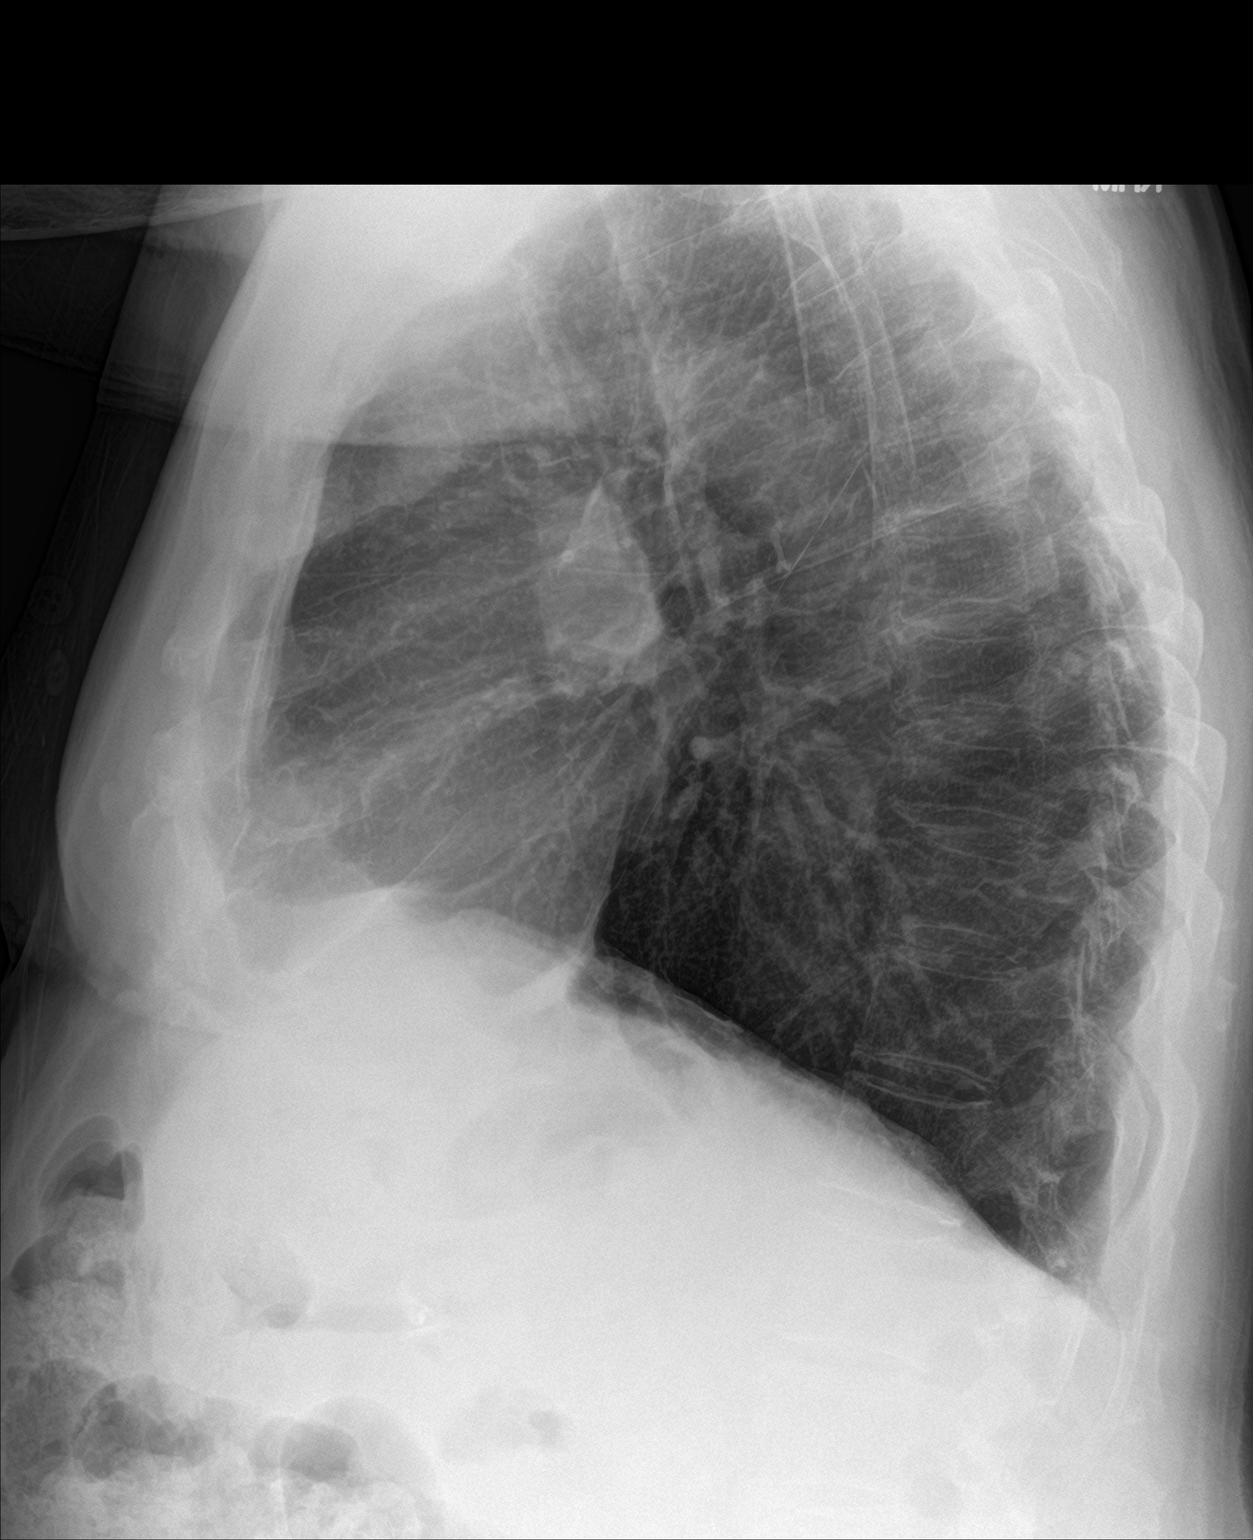

[2 of 2 positions shown; findings below may reference images not displayed]

FINDINGS: Multiple calcified pleural plaques are again noted throughout the
thorax bilaterally, compatible with history of asbestos exposure.
Plaques in the lateral aspect of the left mid to upper thorax appear
more prominent than the prior examination. No acute consolidative
airspace disease. No pleural effusions. No pneumothorax. No evidence
of pulmonary edema. Heart size is normal. Upper mediastinal contours
are within normal limits. Aortic atherosclerosis.

Dedicated views of the right ribs demonstrate no definite acute
displaced right-sided rib fractures.
IMPRESSION: 1. No radiographic evidence of acute cardiopulmonary disease.
2. No acute displaced right-sided rib fractures.
3. Aortic atherosclerosis.
4. Calcified pleural plaques in the thorax bilaterally. These appear
slightly more prominent than the prior examination, particularly in
the mid to upper left hemithorax laterally. If there is any clinical
concern for developing malignancy such as mesothelioma, nonemergent
outpatient follow-up chest CT with contrast would be recommended in
the near future.
